# Patient Record
Sex: Female | Born: 1955 | Race: White | Hispanic: No | Marital: Married | Smoking: Former smoker
Health system: Southern US, Community
[De-identification: ages and names within clinical notes are randomized; demographics above are authoritative.]

## PROBLEM LIST (undated history)

## (undated) DIAGNOSIS — Z905 Acquired absence of kidney: Secondary | ICD-10-CM

## (undated) DIAGNOSIS — Z1501 Genetic susceptibility to malignant neoplasm of breast: Secondary | ICD-10-CM

## (undated) DIAGNOSIS — M1990 Unspecified osteoarthritis, unspecified site: Secondary | ICD-10-CM

## (undated) DIAGNOSIS — M5126 Other intervertebral disc displacement, lumbar region: Secondary | ICD-10-CM

## (undated) DIAGNOSIS — Z9889 Other specified postprocedural states: Secondary | ICD-10-CM

## (undated) DIAGNOSIS — Z1509 Genetic susceptibility to other malignant neoplasm: Secondary | ICD-10-CM

## (undated) DIAGNOSIS — I82409 Acute embolism and thrombosis of unspecified deep veins of unspecified lower extremity: Secondary | ICD-10-CM

## (undated) DIAGNOSIS — D0362 Melanoma in situ of left upper limb, including shoulder: Secondary | ICD-10-CM

## (undated) DIAGNOSIS — R112 Nausea with vomiting, unspecified: Secondary | ICD-10-CM

## (undated) HISTORY — DX: Acquired absence of kidney: Z90.5

## (undated) HISTORY — PX: BUNIONECTOMY: SHX129

## (undated) HISTORY — DX: Other intervertebral disc displacement, lumbar region: M51.26

## (undated) HISTORY — DX: Genetic susceptibility to other malignant neoplasm: Z15.09

## (undated) HISTORY — DX: Acute embolism and thrombosis of unspecified deep veins of unspecified lower extremity: I82.409

## (undated) HISTORY — DX: Melanoma in situ of left upper limb, including shoulder: D03.62

## (undated) HISTORY — DX: Genetic susceptibility to malignant neoplasm of breast: Z15.01

---

## 1985-02-02 HISTORY — PX: TUBAL LIGATION: SHX77

## 1986-02-02 HISTORY — PX: SALPINGOOPHORECTOMY: SHX82

## 1991-02-03 HISTORY — PX: RETINAL DETACHMENT SURGERY: SHX105

## 1991-02-03 HISTORY — PX: CERVICAL SPINE SURGERY: SHX589

## 2000-02-03 HISTORY — PX: ABDOMINAL HYSTERECTOMY: SHX81

## 2005-02-02 HISTORY — PX: TOTAL KNEE ARTHROPLASTY: SHX125

## 2008-06-28 DIAGNOSIS — Z1501 Genetic susceptibility to malignant neoplasm of breast: Secondary | ICD-10-CM

## 2008-06-28 HISTORY — DX: Genetic susceptibility to malignant neoplasm of breast: Z15.01

## 2008-08-09 ENCOUNTER — Encounter: Admission: RE | Admit: 2008-08-09 | Discharge: 2008-08-09 | Payer: Self-pay | Admitting: Obstetrics & Gynecology

## 2008-09-02 HISTORY — PX: SALPINGOOPHORECTOMY: SHX82

## 2008-09-04 ENCOUNTER — Ambulatory Visit (HOSPITAL_COMMUNITY): Admission: RE | Admit: 2008-09-04 | Discharge: 2008-09-04 | Payer: Self-pay | Admitting: Obstetrics & Gynecology

## 2008-09-04 ENCOUNTER — Encounter: Payer: Self-pay | Admitting: Obstetrics & Gynecology

## 2008-09-13 ENCOUNTER — Ambulatory Visit: Payer: Self-pay | Admitting: Oncology

## 2008-10-01 LAB — COMPREHENSIVE METABOLIC PANEL
ALT: 17 U/L (ref 0–35)
AST: 18 U/L (ref 0–37)
Albumin: 4.1 g/dL (ref 3.5–5.2)
Creatinine, Ser: 0.71 mg/dL (ref 0.40–1.20)
Potassium: 3.9 mEq/L (ref 3.5–5.3)

## 2008-10-01 LAB — CBC WITH DIFFERENTIAL/PLATELET
Basophils Absolute: 0 10*3/uL (ref 0.0–0.1)
Eosinophils Absolute: 0.5 10*3/uL (ref 0.0–0.5)
HCT: 35.2 % (ref 34.8–46.6)
LYMPH%: 30.5 % (ref 14.0–49.7)
MONO#: 0.3 10*3/uL (ref 0.1–0.9)
NEUT#: 1.9 10*3/uL (ref 1.5–6.5)
Platelets: 269 10*3/uL (ref 145–400)
WBC: 3.9 10*3/uL (ref 3.9–10.3)

## 2008-11-22 ENCOUNTER — Ambulatory Visit: Payer: Self-pay | Admitting: Oncology

## 2009-08-12 ENCOUNTER — Encounter: Admission: RE | Admit: 2009-08-12 | Discharge: 2009-08-12 | Payer: Self-pay | Admitting: Obstetrics & Gynecology

## 2009-10-23 HISTORY — PX: COLONOSCOPY: SHX174

## 2009-10-23 LAB — HM COLONOSCOPY

## 2010-03-21 ENCOUNTER — Other Ambulatory Visit: Payer: Self-pay | Admitting: Oncology

## 2010-03-21 ENCOUNTER — Encounter (HOSPITAL_BASED_OUTPATIENT_CLINIC_OR_DEPARTMENT_OTHER): Payer: BC Managed Care – PPO | Admitting: Oncology

## 2010-03-21 DIAGNOSIS — Z1501 Genetic susceptibility to malignant neoplasm of breast: Secondary | ICD-10-CM

## 2010-03-21 LAB — CBC WITH DIFFERENTIAL/PLATELET
BASO%: 1.2 % (ref 0.0–2.0)
Basophils Absolute: 0.1 10*3/uL (ref 0.0–0.1)
Eosinophils Absolute: 0.2 10*3/uL (ref 0.0–0.5)
HCT: 36.7 % (ref 34.8–46.6)
HGB: 12.7 g/dL (ref 11.6–15.9)
LYMPH%: 33 % (ref 14.0–49.7)
MCV: 99.7 fL (ref 79.5–101.0)
MONO#: 0.4 10*3/uL (ref 0.1–0.9)
NEUT#: 2.4 10*3/uL (ref 1.5–6.5)
NEUT%: 53.6 % (ref 38.4–76.8)
Platelets: 224 10*3/uL (ref 145–400)
WBC: 4.5 10*3/uL (ref 3.9–10.3)
lymph#: 1.5 10*3/uL (ref 0.9–3.3)

## 2010-03-21 LAB — COMPREHENSIVE METABOLIC PANEL
ALT: 18 U/L (ref 0–35)
AST: 19 U/L (ref 0–37)
Albumin: 4.6 g/dL (ref 3.5–5.2)
BUN: 16 mg/dL (ref 6–23)
Potassium: 3.8 mEq/L (ref 3.5–5.3)
Sodium: 141 mEq/L (ref 135–145)
Total Bilirubin: 0.9 mg/dL (ref 0.3–1.2)

## 2010-03-28 ENCOUNTER — Encounter (HOSPITAL_BASED_OUTPATIENT_CLINIC_OR_DEPARTMENT_OTHER): Payer: BC Managed Care – PPO | Admitting: Oncology

## 2010-03-28 DIAGNOSIS — Z1501 Genetic susceptibility to malignant neoplasm of breast: Secondary | ICD-10-CM

## 2010-05-10 LAB — CBC
HCT: 37.8 % (ref 36.0–46.0)
Hemoglobin: 12.8 g/dL (ref 12.0–15.0)
MCV: 102.4 fL — ABNORMAL HIGH (ref 78.0–100.0)
RDW: 12.9 % (ref 11.5–15.5)
WBC: 4.3 10*3/uL (ref 4.0–10.5)

## 2010-05-10 LAB — URINALYSIS, ROUTINE W REFLEX MICROSCOPIC
Bilirubin Urine: NEGATIVE
Protein, ur: NEGATIVE mg/dL
Urobilinogen, UA: 0.2 mg/dL (ref 0.0–1.0)
pH: 6.5 (ref 5.0–8.0)

## 2010-05-10 LAB — BASIC METABOLIC PANEL
CO2: 29 mEq/L (ref 19–32)
Creatinine, Ser: 0.67 mg/dL (ref 0.4–1.2)
GFR calc non Af Amer: >60 mL/min (ref >60)
Sodium: 141 mEq/L (ref 135–145)

## 2010-06-17 NOTE — Discharge Summary (Signed)
NAME:  Nicole Casey, SIMIEN NO.:  [id number]   MEDICAL RECORD NO.:  [id number]          PATIENT TYPE:  OIB   LOCATION:  1539                         FACILITY:  [facility]   PHYSICIAN:  M. Leda Quail, MD  DATE OF BIRTH:  [birth year]   DATE OF ADMISSION:  09/04/2008  DATE OF DISCHARGE:  09/04/2008                               DISCHARGE SUMMARY   ADMISSION DIAGNOSES:  45. A 55 year old G2, P2 married white female with a history of BRCA      (breast cancer)genetic positive testing, very strong family history      of ovarian and breast cancer.  2. History of right salpingo-oophorectomy in 1988.  3. History of hysterectomy in 2002 secondary to fibroids.   DISCHARGE DIAGNOSES:  35. A 55 year old G2, P2 married white female with a history of BRCA      (breast cancer)genetic positive testing, very strong family history      of ovarian and breast cancer.  2. History of right salpingo-oophorectomy in 1988.  3. History of hysterectomy in 2002 secondary to fibroids.  4. Adhesions of the bowel across the vaginal cuff along the left side.   PROCEDURES:  Laparoscopic left salpingo-oophorectomy and enterolysis  using the [employer] robot.   HISTORY OF PRESENT ILLNESS:  Nicole Casey is a 55 year old G2, P2 married  white female who has just recently undergone BRCA 1 and 2 genetic  testing secondary to strong family history of ovarian and breast cancer.  Her BRCA 1 testing came back positive.  She has been counseled about  options and will be seeing a hematologist for consideration of tamoxifen  therapy, but she did want to go ahead and proceed with left salpingo-  oophorectomy to decrease her risk of ovarian cancer.  She has been  counseled about risks and benefits of this and decided to proceed.   HOSPITAL COURSE:  The patient was admitted to same-day surgery.  She was  taken to the operating room where enterolysis of bowel adhesions to the  left sidewall and to the vaginal cuff was  performed as well as a  laparoscopic LSO.  The patient tolerated the procedure very well.  She  had less than 100 mL of blood loss.  From there, she was taken to the  recovery room.  She does have a long history of significant  postoperative nausea and emesis.  Because of this, I decided to observe  her through the day and make sure that she is doing well before sending  her home.  The patient after an appropriate time in the recovery room  was taken to the fifth floor.  She is able to tolerate liquids and  regular food and was able to be up and ambulate as well as void without  difficulty.  She was seen at approximately 5 o'clock in the evening and  was doing very well.  She had excellent pain control and no nausea.  She  was ready to go home, and I felt comfortable with this.  No  postoperative labs were done today.  Her preop labs this morning showed  a hemoglobin  of 12.8.  Electrolytes were normal.  Sodium of 141,  potassium a 4, BUN and creatinine of 13 and 0.6.  UA was negative.   PATHOLOGY PENDING:  Pathology for the left tube and ovary.   DISCHARGE INSTRUCTIONS:  Instructions were provided in written and  verbal form.  The patient will be discharged with a prescription for  Percocet 5/325, 1-2 tablets p.o. q.4-6 h. p.r.n. pain, and she will also  take Aleve  over-the-counter tablets as needed.  She will see me in 3-4 weeks, and I  will let her know about her pathology report when it is back later this  week.  At this point, she will be discharged to home with her family,  and she is in stable condition.      Lum Keas, MD  Electronically Signed     MSM/MEDQ  D:  09/04/2008  T:  09/04/2008  Job:  [id number]

## 2010-06-17 NOTE — Op Note (Signed)
NAME:  Nicole Casey, Nicole Casey            ACCOUNT NO.:  [id number]   MEDICAL RECORD NO.:  [id number]          PATIENT TYPE:  AMB   LOCATION:  DAY                          FACILITY:  [facility]   PHYSICIAN:  M. Leda Quail, MD  DATE OF BIRTH:  1955/04/05   DATE OF PROCEDURE:  09/04/2008  DATE OF DISCHARGE:                               OPERATIVE REPORT   PREOPERATIVE DIAGNOSES:  68. A 55 year old G2 P2 married white female with BRCA1-positive      genetic testing.  2. History of right salpingo-oophorectomy in 1988.  3. History of total abdominal hysterectomy secondary to fibroids in      2002.  4. History of bilateral tubal ligation.  5. Strong family history of ovarian and breast cancer which ultimately      led to the genetic testing.   POSTOPERATIVE DIAGNOSES:  29. A 55 year old G2 P2 married white female with BRCA1-positive      genetic testing.  2. History of right salpingo-oophorectomy in 1988.  3. History of total abdominal hysterectomy secondary to fibroids in      2002.  4. History of bilateral tubal ligation.  5. Strong family history of ovarian and breast cancer which ultimately      led to the genetic testing.  6. Adhesions of the bowel from the vaginal cuff all way around to the      left side and up the left sidewall on top of the fallopian tube and      ovary.   PROCEDURE:  Laparoscopic left salpingo-oophorectomy, done robotically,  and enterolysis.   ASSISTANT:  Cynthia P. Romine, M.D.   ANESTHESIA:  General endotracheal.  Dr. Jean Rosenthal oversaw the case.   FINDINGS:  Normal-appearing left ovary and fallopian tube although it  was adhesed to the sidewall and to the bowel.  There were also adhesions  of the bowel to the vaginal cuff starting on the right edge of the cuff  all way across and up on the left side which obscured the visualization  of the ovary until enterolysis was performed during the procedure.   SPECIMEN:  Left tube and ovary sent to pathology.   ESTIMATED BLOOD LOSS:  Anesthesia estimates 100 mL; probably this is an  overestimate.   URINE OUTPUT:  520 mL in the Foley catheter.   FLUIDS:  1000 mL of LR.   COMPLICATIONS:  None.   INDICATIONS:  Nicole Casey is a very nice 55 year old G2 P2 married white  female who just moved to [place] with her husband and children  for her husband's job.  Before removing she underwent genetic testing at  [facility] and did not have the full official report,  but was informed that she was genetically testing positive.  Initially  she believed that she was BRCA1 and BRCA2 positive.  I obtained her  records which actually showed that she was only BRCA1 positive.  We did  discuss implications of this increased risk of ovarian cancer.  She has  two paternal cousins who have both been diagnosed with breast cancer, a  paternal cousin with omental cancer, paternal grandmother with  ovarian  cancer, and paternal cousin with ovarian cancer.  All this in addition  to her daughter having an usual type of leukemia has led to her  proceeding with the genetic testing.  Risks and benefits of this  procedure were discussed with the patient.  There were all documented in  my office chart.  The patient wanted to go ahead and get this done as  soon as possible and was scheduled within a week after making her  decision.  She presents for procedure today.   PROCEDURE:  Patient taken to the operating room.  She is placed in the  supine position.  General endotracheal anesthesia is administered by the  anesthesia staff without difficulty.  Legs are positioned in the low  lithotomy position in [place] stirrups and then lifted to the high  lithotomy position.  The abdomen and perineum, inner thighs and vagina  are prepped in normal sterile fashion.  A sponge stick is placed in the  vagina.  Foley inserted under sterile conditions and the patient is then  draped in normal sterile fashion.  Legs are  positioned in the low  lithotomy position at this point.   Attention is turned to the umbilicus.  The skin is anesthetized with  0.25% Marcaine and incision is made above the umbilicus.  This is  dissected down to the fascia which is elevated with Kocher clamps and  incised sharply with curved Mayo scissors.  The peritoneum is identified  at this point and entered bluntly using a hemostat.  The operator's  index fingers pass through this incision and there are no adhesions  noted on the anterior abdominal wall.  A pursestring suture of 0 Vicryl  is placed around the incision at the fascial layer, then a Roseanne Reno is  placed through this incision and held into place with sutures.  The  scope is then used to visualize the pelvis.  The patient is placed with  the table in full and steep Trendelenburg.  There are obvious adhesions  of the bowel on the left side to the top of the cuff.  There is no  immediate identification of the left ovary.  Decision is made to go  ahead and insert ports.  Approximately 10 cm right and left of the  umbilical port the skin is anesthetized with 0.25% Marcaine.  The skin  is transilluminated to look for vessels.  Skin incisions are made and  #10 nondisposable trocars and ports are inserted in the right and left  lower quadrant.  Then, deeper on the right side, skin is  transilluminated and a port site location is chosen for the assistant  port.  Skin is anesthetized and incision is made with a knife, then  under direct visualization a #12 disposable trocar and port are placed  in the right lower quadrant.   At this point the robot is docked between the legs in normal standard  fashion.  There no assitant sat between the legs as the uterus had  already been removed in 2002.  The PK [place] is inserted into the left  robotic arm, attached to bipolar cautery, and endoscopic scissors,  attached to monopolar cautery, inserted into the right arm.  A Cobra  grasper  is used by Museum/gallery curator.   Initially for the first hour or so of the procedure enterolysis of the  bowel from the vaginal cuff and pelvic sidewall was performed.  Initially the ureter could not be identified so that careful attention  was made to keep the dissection only in filmy tissue that could clearly  be identified as safe for dissection.  Once the bowel was dissected off  of the cuff and off of the inferior portion of the left sidewall the  ureter peristalsis down low deep in the pelvis could be noted.  So the  course for the ureter could be better visualized.  Then the bowel was  dissected off of the left side wall.  At this point, the ovary could be  seen beneath the bowel.  Bowel was dissected off of the top of the  ovary.  Then slowly the ovary was dissected carefully off of the left  pelvic sidewall.  Traction was used and mostly endoscopic scissors  without cautery was used.  Only in a few places was monopolar cautery  used.  The tube was also adhesed, but the ovary could be separated off  the sidewall and then separated from the tube.  So, using bipolar  cautery, the ovary was taken off of the fallopian tube and the vessels  attaching the ovary to the tube were cauterized with bipolar cautery.  The ovary was brought out the assistant port without difficulty and  intact.   At this point the ureter was well below the level of dissection and the  tube was much more clearly identified at this point.  The remaining  portion of the tube was elevated and cleanly taken off of the sidewall.  It does appear that the ovary had been sutured to the left round  ligament.  The tube was scarred to the round ligament as well.  At this  point the round ligament was clearly identified which was difficult at  the beginning of surgery and it was bipolar cauterized for excellent  hemostasis.  The IP ligament was very clearly identified using the PK  [place] the IP ligament was serially  clamped, cauterized and incised to  completely traverse the IP ligament.  At this point the fallopian tube  was free as well from the left side.  This was brought out of the  assistant port.   Pelvis was irrigated with copious amounts of warm normal saline and  there was no bleeding noted along the pelvic sidewall.  The ureter  peristalsis was noted again and was definitely below the level of  dissection and particularly where the IP ligament was incised.  The  right side there was no ovary or tube present.  At this point an  Interceed layer was placed in the pelvis and placed across the vaginal  cuff and up on the left sidewall to help prevent future adhesion  formation.  At this point the procedure was ended.   The irrigant was removed from the pelvis.  The robot was undocked and  the instruments were removed under direct visualization of the  laparoscope.  The patient was taken out of Trendelenburg positioning and  the laparoscope was removed from the midline.  The patient was given  several deep breaths by the C.R.N.A. to try and get any gas out of the  abdomen.  Then the ports were removed under direct visualization.  Finally, the midline port was removed.  The operator's index finger was  placed into the midline port to ensure no bowel was present, none was,  and the incision was closed tightly with a pursestring suture.  The  right lower quadrant incision of the 12-mm port was closed at the  fascial level with figure-of-eight  suture of 0 Vicryl.  The incisions  were cleansed and closed with subcuticular stitches of 3-0 Vicryl.  Then  Dermabond was applied across and.  Instruments from the vagina were  removed and the Foley catheter was removed from the bladder.  Sponge,  lap, needle, instrument counts were correct x2 at this point.  The  patient was positioned back in supine position.  She was awakened from  anesthesia without difficulty and taken to the recovery room in stable   condition.      Lum Keas, MD  Electronically Signed     MSM/MEDQ  D:  09/04/2008  T:  09/04/2008  Job:  [id number]

## 2010-07-14 ENCOUNTER — Other Ambulatory Visit: Payer: Self-pay | Admitting: Obstetrics & Gynecology

## 2010-07-14 DIAGNOSIS — Z1231 Encounter for screening mammogram for malignant neoplasm of breast: Secondary | ICD-10-CM

## 2010-07-15 ENCOUNTER — Other Ambulatory Visit: Payer: Self-pay | Admitting: Oncology

## 2010-07-15 ENCOUNTER — Encounter (HOSPITAL_BASED_OUTPATIENT_CLINIC_OR_DEPARTMENT_OTHER): Payer: BC Managed Care – PPO | Admitting: Oncology

## 2010-07-15 DIAGNOSIS — Z1501 Genetic susceptibility to malignant neoplasm of breast: Secondary | ICD-10-CM

## 2010-07-15 LAB — CBC WITH DIFFERENTIAL/PLATELET
BASO%: 0.9 % (ref 0.0–2.0)
Basophils Absolute: 0 10*3/uL (ref 0.0–0.1)
HCT: 36.3 % (ref 34.8–46.6)
MCH: 34.3 pg — ABNORMAL HIGH (ref 25.1–34.0)
MONO#: 0.3 10*3/uL (ref 0.1–0.9)
MONO%: 6.8 % (ref 0.0–14.0)
NEUT%: 58.3 % (ref 38.4–76.8)
Platelets: 262 10*3/uL (ref 145–400)
RBC: 3.63 10*6/uL — ABNORMAL LOW (ref 3.70–5.45)
RDW: 12.9 % (ref 11.2–14.5)

## 2010-07-15 LAB — COMPREHENSIVE METABOLIC PANEL
AST: 23 U/L (ref 0–37)
Albumin: 4.5 g/dL (ref 3.5–5.2)
Alkaline Phosphatase: 74 U/L (ref 39–117)
Calcium: 9.2 mg/dL (ref 8.4–10.5)
Glucose, Bld: 108 mg/dL — ABNORMAL HIGH (ref 70–99)
Potassium: 4.3 mEq/L (ref 3.5–5.3)
Sodium: 136 mEq/L (ref 135–145)
Total Protein: 6.6 g/dL (ref 6.0–8.3)

## 2010-08-15 ENCOUNTER — Ambulatory Visit
Admission: RE | Admit: 2010-08-15 | Discharge: 2010-08-15 | Disposition: A | Discharge status: Home / Self Care | Payer: BC Managed Care – PPO | Source: Ambulatory Visit | Attending: Obstetrics & Gynecology | Admitting: Obstetrics & Gynecology

## 2010-08-15 DIAGNOSIS — Z1231 Encounter for screening mammogram for malignant neoplasm of breast: Secondary | ICD-10-CM

## 2010-08-18 ENCOUNTER — Ambulatory Visit: Payer: BC Managed Care – PPO

## 2010-09-09 ENCOUNTER — Other Ambulatory Visit: Payer: Self-pay | Admitting: Obstetrics & Gynecology

## 2010-09-09 DIAGNOSIS — Z1501 Genetic susceptibility to malignant neoplasm of breast: Secondary | ICD-10-CM

## 2010-09-17 ENCOUNTER — Other Ambulatory Visit: Payer: BC Managed Care – PPO

## 2011-07-16 ENCOUNTER — Other Ambulatory Visit: Payer: Self-pay | Admitting: Obstetrics & Gynecology

## 2011-07-16 DIAGNOSIS — Z1231 Encounter for screening mammogram for malignant neoplasm of breast: Secondary | ICD-10-CM

## 2011-08-17 ENCOUNTER — Ambulatory Visit
Admission: RE | Admit: 2011-08-17 | Discharge: 2011-08-17 | Disposition: A | Discharge status: Home / Self Care | Payer: BC Managed Care – PPO | Source: Ambulatory Visit | Attending: Obstetrics & Gynecology | Admitting: Obstetrics & Gynecology

## 2011-08-17 DIAGNOSIS — Z1231 Encounter for screening mammogram for malignant neoplasm of breast: Secondary | ICD-10-CM

## 2012-07-11 ENCOUNTER — Other Ambulatory Visit: Payer: Self-pay

## 2012-07-11 DIAGNOSIS — Z1231 Encounter for screening mammogram for malignant neoplasm of breast: Secondary | ICD-10-CM

## 2012-07-13 ENCOUNTER — Other Ambulatory Visit: Payer: Self-pay | Admitting: Neurosurgery

## 2012-07-13 DIAGNOSIS — M4310 Spondylolisthesis, site unspecified: Secondary | ICD-10-CM

## 2012-07-22 ENCOUNTER — Ambulatory Visit
Admission: RE | Admit: 2012-07-22 | Discharge: 2012-07-22 | Disposition: A | Discharge status: Home / Self Care | Payer: BC Managed Care – PPO | Source: Ambulatory Visit | Attending: Neurosurgery | Admitting: Neurosurgery

## 2012-07-22 VITALS — BP 110/62 | HR 58

## 2012-07-22 DIAGNOSIS — M4310 Spondylolisthesis, site unspecified: Secondary | ICD-10-CM

## 2012-07-22 MED ORDER — IOHEXOL 180 MG/ML  SOLN
20.0000 mL | Freq: Once | INTRAMUSCULAR | Status: AC | PRN
  Administered 2012-07-22: 20 mL via INTRATHECAL

## 2012-07-22 MED ORDER — DIAZEPAM 5 MG PO TABS
10.0000 mg | ORAL_TABLET | Freq: Once | ORAL | Status: AC
  Administered 2012-07-22: 10 mg via ORAL

## 2012-07-22 NOTE — Progress Notes (Signed)
Discharge instructions explained. 

## 2012-08-19 ENCOUNTER — Ambulatory Visit
Admission: RE | Admit: 2012-08-19 | Discharge: 2012-08-19 | Disposition: A | Discharge status: Home / Self Care | Payer: BC Managed Care – PPO | Source: Ambulatory Visit

## 2012-08-19 DIAGNOSIS — Z1231 Encounter for screening mammogram for malignant neoplasm of breast: Secondary | ICD-10-CM

## 2012-09-26 ENCOUNTER — Encounter: Payer: Self-pay | Admitting: Nurse Practitioner

## 2012-09-26 ENCOUNTER — Ambulatory Visit (INDEPENDENT_AMBULATORY_CARE_PROVIDER_SITE_OTHER): Payer: BC Managed Care – PPO | Admitting: Nurse Practitioner

## 2012-09-26 VITALS — BP 110/74 | HR 64 | Resp 16 | Ht 66.0 in | Wt 173.0 lb

## 2012-09-26 DIAGNOSIS — Z01419 Encounter for gynecological examination (general) (routine) without abnormal findings: Secondary | ICD-10-CM

## 2012-09-26 DIAGNOSIS — E559 Vitamin D deficiency, unspecified: Secondary | ICD-10-CM

## 2012-09-26 DIAGNOSIS — Z0000 Encounter for general adult medical examination without abnormal findings: Secondary | ICD-10-CM

## 2012-09-26 LAB — COMPREHENSIVE METABOLIC PANEL
BUN: 20 mg/dL (ref 6–23)
Calcium: 9.4 mg/dL (ref 8.4–10.5)
Potassium: 4.7 mEq/L (ref 3.5–5.3)
Sodium: 138 mEq/L (ref 135–145)
Total Bilirubin: 0.9 mg/dL (ref 0.3–1.2)

## 2012-09-26 LAB — LIPID PANEL
HDL: 74 mg/dL (ref >39)
LDL Cholesterol: 84 mg/dL (ref 0–99)
Total CHOL/HDL Ratio: 2.3 Ratio
Triglycerides: 63 mg/dL (ref <150)

## 2012-09-26 LAB — TSH: TSH: 1.652 u[IU]/mL (ref 0.350–4.500)

## 2012-09-26 LAB — POCT URINALYSIS DIPSTICK
Bilirubin, UA: NEGATIVE
Blood, UA: NEGATIVE
Leukocytes, UA: NEGATIVE
Urobilinogen, UA: NEGATIVE

## 2012-09-26 MED ORDER — VITAMIN D (ERGOCALCIFEROL) 1.25 MG (50000 UNIT) PO CAPS: 50000.0000 [IU] | ORAL_CAPSULE | ORAL | Status: DC

## 2012-09-26 NOTE — Patient Instructions (Signed)

## 2012-09-26 NOTE — Progress Notes (Addendum)
Patient ID: Nicole Casey, female   DOB: [birth year], 57 y.o.   MRN: [id number] 57 y.o. G2, P2. Married Caucasian Fe here for annual exam.  Having back pain and upcoming surgery is pending. 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  No LMP recorded. Patient has had a hysterectomy.          Sexually active: yes  The current method of family planning is status post hysterectomy.    Exercising: yes  Gym/ health club routine includes high impact aerobics  and light weights. Smoker:  no  Health Maintenance: Pap:  09/24/11, WNL, neg HR HPV MMG:  08/19/12, BI-Rads 1: negative Colonoscopy:  10/23/09 normal repeat in 10 years BMD:   08/2008 normal TDaP:  09/19/10 Labs: HB:  12.9 Urine: Neg, pH 7.0   reports that she quit smoking about 36 years ago. Her smoking use included Cigarettes. She has a 2 pack-year smoking history. She has never used smokeless tobacco. She reports that  drinks alcohol. She reports that she does not use illicit drugs.  Past Medical History  Diagnosis Date  . BRCA gene positive   . Herniated lumbar disc without myelopathy     L 3-4-5    Past Surgical History  Procedure Laterality Date  . Cervical spine surgery  1993    hernaited disc repair  . Total knee arthroplasty Right 2007  . Abdominal hysterectomy  2002    fibroids, DUB  . Tubal ligation Bilateral 1987  . Salpingoophorectomy Right 1988    secondary to cyst  . Salpingoophorectomy Left 09/2008    secondary to BRCA +  . Retinal detachment surgery Right 1993    Current Outpatient Prescriptions  Medication Sig Dispense Refill  . Naproxen Sodium (ALEVE) 220 MG CAPS Take 1 capsule by mouth daily.      . Vitamin D, Ergocalciferol, (DRISDOL) 50000 UNITS CAPS capsule Take 1 capsule (50,000 Units total) by mouth every 7 (seven) days.  30 capsule  2   No current facility-administered medications for this visit.    Family History  Problem Relation Age of Onset  . Ovarian cancer Paternal Grandmother   .  Breast cancer Cousin     paternal  . Ovarian cancer Cousin     paternal  . Cervical cancer Cousin     paternal  . Breast cancer Cousin     paternal  . Leukemia Daughter     ROS:  Pertinent items are noted in HPI.  Otherwise, a comprehensive ROS was negative.  Exam:   BP 110/74  Pulse 64  Resp 16  Ht 5\' 6"  (1.676 m)  Wt 173 lb (78.472 kg)  BMI 27.94 kg/m2 Height: 5\' 6"  (167.6 cm)  Ht Readings from Last 3 Encounters:  09/26/12 5\' 6"  (1.676 m)    General appearance: alert, cooperative and appears stated age Head: Normocephalic, without obvious abnormality, atraumatic Neck: no adenopathy, supple, symmetrical, trachea midline and thyroid normal to inspection and palpation Lungs: clear to auscultation bilaterally Breasts: normal appearance, no masses or tenderness Heart: regular rate and rhythm Abdomen: soft, non-tender; no masses,  no organomegaly Extremities: extremities normal, atraumatic, no cyanosis or edema Skin: Skin color, texture, turgor normal. No rashes or lesions Lymph nodes: Cervical, supraclavicular, and axillary nodes normal. No abnormal inguinal nodes palpated Neurologic: Grossly normal   Pelvic: External genitalia:  no lesions              Urethra:  normal appearing urethra with no masses, tenderness or lesions  Bartholin's and Skene's: normal                 Vagina: normal appearing vagina with normal color and discharge, no lesions              Cervix: absent              Pap taken: no Bimanual Exam:  Uterus:  uterus absent              Adnexa: no mass, fullness, tenderness               Rectovaginal: Confirms               Anus:  normal sphincter tone, no lesions  A:  Well Woman with normal exam  Postmenopausal  Strong FMH of OV, Breast cancer   S/P TAH 2002 secondary to fibroids and DUB  S/P BSO 1988 secondary to cyst & 09/2008 secondary to BRCA I +  Patient with BRCA I + = unable to tolerate Tamoxifen 2/12   History of Vit d  deficiency  P:   Pap smear as per guidelines Not done  Mammogram due 7/15  Refill Vit D 50,000 IU weekly for a year - pending labs  Counseled on breast self exam, adequate intake of calcium and vitamin D, diet and exercise return annually or prn  An After Visit Summary was printed and given to the patient.

## 2012-09-27 ENCOUNTER — Telehealth: Payer: Self-pay | Admitting: *Deleted

## 2012-09-27 LAB — VITAMIN D 25 HYDROXY (VIT D DEFICIENCY, FRACTURES): Vit D, 25-Hydroxy: 51 ng/mL (ref 30–89)

## 2012-09-27 NOTE — Progress Notes (Signed)
Encounter reviewed by Dr. Conley Simmonds.  I recommend referral to [facility] to their high risk clinic for those who are cancer gene mutation carriers.

## 2012-09-27 NOTE — Telephone Encounter (Signed)
Message copied by Luisa Dago on Tue Sep 27, 2012  2:05 PM ------      Message from: Ria Comment R      Created: Tue Sep 27, 2012  1:12 PM       Let patient know that labs are all great. ------

## 2012-09-27 NOTE — Telephone Encounter (Signed)
I have attempted to contact this patient by phone with the following results: left message to return my call on answering machine (home per ROI).  

## 2012-09-27 NOTE — Telephone Encounter (Signed)
Patient returning Stephanie's call.

## 2012-09-27 NOTE — Telephone Encounter (Signed)
Message left to return call.

## 2012-09-28 ENCOUNTER — Other Ambulatory Visit: Payer: Self-pay | Admitting: Neurosurgery

## 2012-09-28 LAB — HEMOGLOBIN, FINGERSTICK: Hemoglobin, fingerstick: 12.9 g/dL (ref 12.0–16.0)

## 2012-09-28 NOTE — Telephone Encounter (Signed)
Notified of results

## 2012-09-28 NOTE — Telephone Encounter (Signed)
Returning your phone call.

## 2012-10-03 HISTORY — PX: LUMBAR FUSION: SHX111

## 2012-10-04 ENCOUNTER — Telehealth: Payer: Self-pay | Admitting: Nurse Practitioner

## 2012-10-04 NOTE — Telephone Encounter (Signed)
Left patient a message about calling us back - I wanted to share with her information that Dr. Edward Jolly had about her + BRCA testing and that she should be referred to [facility] for their intensive program for monitoring breast cancer.  She may benefit from Evista.

## 2012-10-04 NOTE — Telephone Encounter (Signed)
Patient is calling you back. 

## 2012-10-13 ENCOUNTER — Encounter (HOSPITAL_COMMUNITY): Payer: Self-pay | Admitting: Respiratory Therapy

## 2012-10-14 ENCOUNTER — Encounter (HOSPITAL_COMMUNITY)
Admission: RE | Admit: 2012-10-14 | Discharge: 2012-10-14 | Disposition: A | Discharge status: Home / Self Care | Payer: BC Managed Care – PPO | Source: Ambulatory Visit | Attending: Neurosurgery | Admitting: Neurosurgery

## 2012-10-14 ENCOUNTER — Encounter (HOSPITAL_COMMUNITY): Payer: Self-pay

## 2012-10-14 DIAGNOSIS — Z01812 Encounter for preprocedural laboratory examination: Secondary | ICD-10-CM | POA: Insufficient documentation

## 2012-10-14 DIAGNOSIS — Z01818 Encounter for other preprocedural examination: Secondary | ICD-10-CM | POA: Insufficient documentation

## 2012-10-14 HISTORY — DX: Unspecified osteoarthritis, unspecified site: M19.90

## 2012-10-14 HISTORY — DX: Nausea with vomiting, unspecified: R11.2

## 2012-10-14 HISTORY — DX: Other specified postprocedural states: Z98.890

## 2012-10-14 LAB — CBC
HCT: 37.8 % (ref 36.0–46.0)
MCHC: 34.7 g/dL (ref 30.0–36.0)
MCV: 97.7 fL (ref 78.0–100.0)
RDW: 12.2 % (ref 11.5–15.5)

## 2012-10-14 LAB — SURGICAL PCR SCREEN: Staphylococcus aureus: NEGATIVE

## 2012-10-14 NOTE — Pre-Procedure Instructions (Signed)
Nicole Casey  10/14/2012   Your procedure is scheduled on:  Sept 24  @ 0830   Report to [facility] at 0630 AM.  Call this number if you have problems the morning of surgery: [phone]   Remember:   Do not eat food or drink liquids after midnight.              Stop  Aleve 7 days prior to your surgery.  No Aspirin, BC's Goody's, Fish oil, Ibuprofen, or Herbal Medications    Do not wear jewelry, make-up or nail polish.  Do not wear lotions, powders, or perfumes. You may wear deodorant.  Do not shave 48 hours prior to surgery.  Do not bring valuables to the hospital.  [facility] is not responsible                   for any belongings or valuables.  Contacts, dentures or bridgework may not be worn into surgery.  Leave suitcase in the car. After surgery it may be brought to your room.  For patients admitted to the hospital, checkout time is 11:00 AM the day of  discharge.   Patients discharged the day of surgery will not be allowed to drive  home.    Special Instructions: Shower using CHG 2 nights before surgery and the night before surgery.  If you shower the day of surgery use CHG.  Use special wash - you have one bottle of CHG for all showers.  You should use approximately 1/3 of the bottle for each shower.   Please read over the following fact sheets that you were given: Pain Booklet, Coughing and Deep Breathing, MRSA Information and Surgical Site Infection Prevention

## 2012-10-14 NOTE — Progress Notes (Signed)
Pt Denies having a stress test, echo, cardio cath. EKG, or chest xray.  Denies having a cardiologist, or PCP.

## 2012-10-18 ENCOUNTER — Telehealth: Payer: Self-pay | Admitting: Nurse Practitioner

## 2012-10-18 NOTE — Telephone Encounter (Signed)
Patient was called on both home and cell number and finally was able to let her know of Dr. Rica Records recommendation about going to the High risk [facility] program for more intensive following of her breast cancer risk with BRCA I positive and unable to tolerate Tamoxifen.  She may benefit from Evista.

## 2012-10-19 NOTE — Telephone Encounter (Signed)
Thanks for the update

## 2012-10-25 MED ORDER — CEFAZOLIN SODIUM-DEXTROSE 2-3 GM-% IV SOLR
2.0000 g | INTRAVENOUS | Status: AC
  Administered 2012-10-26: 2 g via INTRAVENOUS
  Filled 2012-10-25: qty 50

## 2012-10-26 ENCOUNTER — Inpatient Hospital Stay (HOSPITAL_COMMUNITY): Payer: BC Managed Care – PPO

## 2012-10-26 ENCOUNTER — Encounter (HOSPITAL_COMMUNITY): Payer: Self-pay | Admitting: Anesthesiology

## 2012-10-26 ENCOUNTER — Encounter (HOSPITAL_COMMUNITY): Payer: Self-pay | Admitting: *Deleted

## 2012-10-26 ENCOUNTER — Inpatient Hospital Stay (HOSPITAL_COMMUNITY): Payer: BC Managed Care – PPO | Admitting: Anesthesiology

## 2012-10-26 ENCOUNTER — Inpatient Hospital Stay (HOSPITAL_COMMUNITY)
Admission: RE | Admit: 2012-10-26 | Discharge: 2012-10-30 | DRG: 756 | Disposition: A | Discharge status: Home / Self Care | Payer: BC Managed Care – PPO | Source: Ambulatory Visit | Attending: Neurosurgery | Admitting: Neurosurgery

## 2012-10-26 ENCOUNTER — Encounter (HOSPITAL_COMMUNITY)
Admission: RE | Disposition: A | Discharge status: Home / Self Care | Payer: BC Managed Care – PPO | Source: Ambulatory Visit | Attending: Neurosurgery

## 2012-10-26 DIAGNOSIS — M5126 Other intervertebral disc displacement, lumbar region: Principal | ICD-10-CM | POA: Diagnosis present

## 2012-10-26 DIAGNOSIS — Z87891 Personal history of nicotine dependence: Secondary | ICD-10-CM

## 2012-10-26 DIAGNOSIS — M4310 Spondylolisthesis, site unspecified: Secondary | ICD-10-CM | POA: Diagnosis present

## 2012-10-26 DIAGNOSIS — Z96659 Presence of unspecified artificial knee joint: Secondary | ICD-10-CM

## 2012-10-26 SURGERY — POSTERIOR LUMBAR FUSION 1 LEVEL
Anesthesia: General | Site: Back | Wound class: Clean

## 2012-10-26 MED ORDER — ONDANSETRON HCL 4 MG/2ML IJ SOLN
INTRAMUSCULAR | Status: DC | PRN
  Administered 2012-10-26: 4 mg via INTRAVENOUS

## 2012-10-26 MED ORDER — SODIUM CHLORIDE 0.9 % IJ SOLN: 3.0000 mL | INTRAMUSCULAR | Status: DC | PRN

## 2012-10-26 MED ORDER — SCOPOLAMINE 1 MG/3DAYS TD PT72: 1.0000 | MEDICATED_PATCH | TRANSDERMAL | Status: DC

## 2012-10-26 MED ORDER — ROCURONIUM BROMIDE 100 MG/10ML IV SOLN
INTRAVENOUS | Status: DC | PRN
  Administered 2012-10-26: 50 mg via INTRAVENOUS

## 2012-10-26 MED ORDER — ZOLPIDEM TARTRATE 5 MG PO TABS: 5.0000 mg | ORAL_TABLET | Freq: Every evening | ORAL | Status: DC | PRN

## 2012-10-26 MED ORDER — SODIUM CHLORIDE 0.9 % IV SOLN
INTRAVENOUS | Status: DC
  Administered 2012-10-26: 14:00:00 via INTRAVENOUS

## 2012-10-26 MED ORDER — SODIUM CHLORIDE 0.9 % IV SOLN: 250.0000 mL | INTRAVENOUS | Status: DC

## 2012-10-26 MED ORDER — SCOPOLAMINE 1 MG/3DAYS TD PT72
MEDICATED_PATCH | TRANSDERMAL | Status: AC
  Administered 2012-10-26: 1 via TRANSDERMAL
  Filled 2012-10-26: qty 1

## 2012-10-26 MED ORDER — ONDANSETRON HCL 4 MG/2ML IJ SOLN: 4.0000 mg | Freq: Four times a day (QID) | INTRAMUSCULAR | Status: DC | PRN

## 2012-10-26 MED ORDER — OXYCODONE HCL 5 MG PO TABS
5.0000 mg | ORAL_TABLET | Freq: Once | ORAL | Status: AC | PRN
  Administered 2012-10-26: 5 mg via ORAL

## 2012-10-26 MED ORDER — CEFAZOLIN SODIUM 1-5 GM-% IV SOLN
1.0000 g | Freq: Three times a day (TID) | INTRAVENOUS | Status: AC
  Administered 2012-10-26 – 2012-10-27 (×2): 1 g via INTRAVENOUS
  Filled 2012-10-26 (×3): qty 50

## 2012-10-26 MED ORDER — OXYCODONE HCL 5 MG/5ML PO SOLN: 5.0000 mg | Freq: Once | ORAL | Status: AC | PRN

## 2012-10-26 MED ORDER — ACETAMINOPHEN 325 MG PO TABS
650.0000 mg | ORAL_TABLET | ORAL | Status: DC | PRN
  Administered 2012-10-28 – 2012-10-30 (×10): 650 mg via ORAL
  Filled 2012-10-26 (×10): qty 2

## 2012-10-26 MED ORDER — MORPHINE SULFATE (PF) 1 MG/ML IV SOLN
INTRAVENOUS | Status: DC
  Administered 2012-10-26: 4.5 mg via INTRAVENOUS
  Administered 2012-10-26: 13:00:00 via INTRAVENOUS
  Administered 2012-10-26: 3 mg via INTRAVENOUS
  Administered 2012-10-27: 3.6 mg via INTRAVENOUS
  Administered 2012-10-27: 21:00:00 via INTRAVENOUS
  Administered 2012-10-27: 1.5 mg via INTRAVENOUS
  Administered 2012-10-27: 3 mg via INTRAVENOUS
  Administered 2012-10-27: 2.19 mg via INTRAVENOUS
  Administered 2012-10-27: 3 mg via INTRAVENOUS
  Administered 2012-10-27: 3.4 mg via INTRAVENOUS
  Administered 2012-10-27 – 2012-10-28 (×2): 1.5 mg via INTRAVENOUS
  Filled 2012-10-26: qty 25

## 2012-10-26 MED ORDER — HYDROMORPHONE HCL PF 1 MG/ML IJ SOLN
INTRAMUSCULAR | Status: AC
  Filled 2012-10-26: qty 1

## 2012-10-26 MED ORDER — PROPOFOL 10 MG/ML IV BOLUS
INTRAVENOUS | Status: DC | PRN
  Administered 2012-10-26: 100 mg via INTRAVENOUS

## 2012-10-26 MED ORDER — PROMETHAZINE HCL 25 MG/ML IJ SOLN: 6.2500 mg | INTRAMUSCULAR | Status: DC | PRN

## 2012-10-26 MED ORDER — SODIUM CHLORIDE 0.9 % IJ SOLN: 9.0000 mL | INTRAMUSCULAR | Status: DC | PRN

## 2012-10-26 MED ORDER — SODIUM CHLORIDE 0.9 % IJ SOLN
3.0000 mL | Freq: Two times a day (BID) | INTRAMUSCULAR | Status: DC
  Administered 2012-10-28: 3 mL via INTRAVENOUS

## 2012-10-26 MED ORDER — FENTANYL CITRATE 0.05 MG/ML IJ SOLN
INTRAMUSCULAR | Status: DC | PRN
  Administered 2012-10-26 (×2): 50 ug via INTRAVENOUS
  Administered 2012-10-26 (×2): 150 ug via INTRAVENOUS
  Administered 2012-10-26: 100 ug via INTRAVENOUS

## 2012-10-26 MED ORDER — ACETAMINOPHEN 650 MG RE SUPP: 650.0000 mg | RECTAL | Status: DC | PRN

## 2012-10-26 MED ORDER — PHENOL 1.4 % MT LIQD: 1.0000 | OROMUCOSAL | Status: DC | PRN

## 2012-10-26 MED ORDER — ARTIFICIAL TEARS OP OINT
TOPICAL_OINTMENT | OPHTHALMIC | Status: DC | PRN
  Administered 2012-10-26: 1 via OPHTHALMIC

## 2012-10-26 MED ORDER — MIDAZOLAM HCL 2 MG/2ML IJ SOLN: 0.5000 mg | Freq: Once | INTRAMUSCULAR | Status: DC | PRN

## 2012-10-26 MED ORDER — DIAZEPAM 5 MG PO TABS
5.0000 mg | ORAL_TABLET | Freq: Four times a day (QID) | ORAL | Status: DC | PRN
  Administered 2012-10-27 – 2012-10-29 (×2): 5 mg via ORAL
  Filled 2012-10-26 (×3): qty 1

## 2012-10-26 MED ORDER — MIDAZOLAM HCL 5 MG/5ML IJ SOLN
INTRAMUSCULAR | Status: DC | PRN
  Administered 2012-10-26: 2 mg via INTRAVENOUS

## 2012-10-26 MED ORDER — DEXAMETHASONE SODIUM PHOSPHATE 4 MG/ML IJ SOLN
INTRAMUSCULAR | Status: DC | PRN
  Administered 2012-10-26: 8 mg via INTRAVENOUS

## 2012-10-26 MED ORDER — MORPHINE SULFATE (PF) 1 MG/ML IV SOLN
INTRAVENOUS | Status: AC
  Filled 2012-10-26: qty 25

## 2012-10-26 MED ORDER — DIPHENHYDRAMINE HCL 12.5 MG/5ML PO ELIX: 12.5000 mg | ORAL_SOLUTION | Freq: Four times a day (QID) | ORAL | Status: DC | PRN

## 2012-10-26 MED ORDER — OXYCODONE HCL 5 MG PO TABS
ORAL_TABLET | ORAL | Status: AC
  Filled 2012-10-26: qty 1

## 2012-10-26 MED ORDER — LACTATED RINGERS IV SOLN
INTRAVENOUS | Status: DC | PRN
  Administered 2012-10-26 (×3): via INTRAVENOUS

## 2012-10-26 MED ORDER — GLYCOPYRROLATE 0.2 MG/ML IJ SOLN
INTRAMUSCULAR | Status: DC | PRN
  Administered 2012-10-26: 0.4 mg via INTRAVENOUS

## 2012-10-26 MED ORDER — LIDOCAINE HCL (CARDIAC) 20 MG/ML IV SOLN
INTRAVENOUS | Status: DC | PRN
  Administered 2012-10-26: 30 mg via INTRAVENOUS

## 2012-10-26 MED ORDER — MEPERIDINE HCL 25 MG/ML IJ SOLN: 6.2500 mg | INTRAMUSCULAR | Status: DC | PRN

## 2012-10-26 MED ORDER — MENTHOL 3 MG MT LOZG: 1.0000 | LOZENGE | OROMUCOSAL | Status: DC | PRN

## 2012-10-26 MED ORDER — THROMBIN 20000 UNITS EX SOLR
CUTANEOUS | Status: DC | PRN
  Administered 2012-10-26: 10:00:00 via TOPICAL

## 2012-10-26 MED ORDER — NEOSTIGMINE METHYLSULFATE 1 MG/ML IJ SOLN
INTRAMUSCULAR | Status: DC | PRN
  Administered 2012-10-26: 3 mg via INTRAVENOUS

## 2012-10-26 MED ORDER — ONDANSETRON HCL 4 MG/2ML IJ SOLN
4.0000 mg | INTRAMUSCULAR | Status: DC | PRN
  Administered 2012-10-27: 4 mg via INTRAVENOUS
  Filled 2012-10-26: qty 2

## 2012-10-26 MED ORDER — NALOXONE HCL 0.4 MG/ML IJ SOLN: 0.4000 mg | INTRAMUSCULAR | Status: DC | PRN

## 2012-10-26 MED ORDER — 0.9 % SODIUM CHLORIDE (POUR BTL) OPTIME
TOPICAL | Status: DC | PRN
  Administered 2012-10-26: 1000 mL

## 2012-10-26 MED ORDER — HYDROMORPHONE HCL PF 1 MG/ML IJ SOLN
0.2500 mg | INTRAMUSCULAR | Status: DC | PRN
  Administered 2012-10-26 (×4): 0.5 mg via INTRAVENOUS

## 2012-10-26 MED ORDER — DIPHENHYDRAMINE HCL 50 MG/ML IJ SOLN: 12.5000 mg | Freq: Four times a day (QID) | INTRAMUSCULAR | Status: DC | PRN

## 2012-10-26 MED ORDER — OXYCODONE-ACETAMINOPHEN 5-325 MG PO TABS: 1.0000 | ORAL_TABLET | ORAL | Status: DC | PRN

## 2012-10-26 SURGICAL SUPPLY — 68 items
BENZOIN TINCTURE PRP APPL 2/3 (GAUZE/BANDAGES/DRESSINGS) ×2 IMPLANT
BLADE SURG ROTATE 9660 (MISCELLANEOUS) IMPLANT
BUR ACORN 6.0 (BURR) ×2 IMPLANT
BUR MATCHSTICK NEURO 3.0 LAGG (BURR) ×2 IMPLANT
CANISTER SUCTION 2500CC (MISCELLANEOUS) ×2 IMPLANT
CAP LOCKING REVERE (Cap) ×8 IMPLANT
CLOTH BEACON ORANGE TIMEOUT ST (SAFETY) IMPLANT
CONT SPEC 4OZ CLIKSEAL STRL BL (MISCELLANEOUS) ×2 IMPLANT
COVER BACK TABLE 24X17X13 BIG (DRAPES) IMPLANT
COVER TABLE BACK 60X90 (DRAPES) ×2 IMPLANT
DRAPE C-ARM 42X72 X-RAY (DRAPES) ×4 IMPLANT
DRAPE LAPAROTOMY 100X72X124 (DRAPES) ×2 IMPLANT
DRAPE POUCH INSTRU U-SHP 10X18 (DRAPES) ×2 IMPLANT
DRSG OPSITE POSTOP 4X8 (GAUZE/BANDAGES/DRESSINGS) ×4 IMPLANT
DRSG PAD ABDOMINAL 8X10 ST (GAUZE/BANDAGES/DRESSINGS) ×2 IMPLANT
DURAPREP 26ML APPLICATOR (WOUND CARE) ×2 IMPLANT
ELECT BLADE 4.0 EZ CLEAN MEGAD (MISCELLANEOUS) ×2
ELECT REM PT RETURN 9FT ADLT (ELECTROSURGICAL) ×2
ELECTRODE BLDE 4.0 EZ CLN MEGD (MISCELLANEOUS) ×1 IMPLANT
ELECTRODE REM PT RTRN 9FT ADLT (ELECTROSURGICAL) ×1 IMPLANT
EVACUATOR 1/8 PVC DRAIN (DRAIN) IMPLANT
EVACUATOR 3/16  PVC DRAIN (DRAIN) ×1
EVACUATOR 3/16 PVC DRAIN (DRAIN) ×1 IMPLANT
GAUZE SPONGE 4X4 16PLY XRAY LF (GAUZE/BANDAGES/DRESSINGS) ×2 IMPLANT
GLOVE BIOGEL M 8.0 STRL (GLOVE) ×4 IMPLANT
GLOVE BIOGEL PI IND STRL 7.5 (GLOVE) ×1 IMPLANT
GLOVE BIOGEL PI IND STRL 8 (GLOVE) ×1 IMPLANT
GLOVE BIOGEL PI INDICATOR 7.5 (GLOVE) ×1
GLOVE BIOGEL PI INDICATOR 8 (GLOVE) ×1
GLOVE ECLIPSE 7.0 STRL STRAW (GLOVE) ×2 IMPLANT
GLOVE ECLIPSE 7.5 STRL STRAW (GLOVE) ×6 IMPLANT
GLOVE EXAM NITRILE LRG STRL (GLOVE) IMPLANT
GLOVE EXAM NITRILE MD LF STRL (GLOVE) IMPLANT
GLOVE EXAM NITRILE XL STR (GLOVE) IMPLANT
GLOVE EXAM NITRILE XS STR PU (GLOVE) IMPLANT
GOWN BRE IMP SLV AUR LG STRL (GOWN DISPOSABLE) ×4 IMPLANT
GOWN BRE IMP SLV AUR XL STRL (GOWN DISPOSABLE) ×2 IMPLANT
GOWN STRL REIN 2XL LVL4 (GOWN DISPOSABLE) ×2 IMPLANT
KIT BASIN OR (CUSTOM PROCEDURE TRAY) ×2 IMPLANT
KIT INFUSE MEDIUM (Orthopedic Implant) ×2 IMPLANT
KIT ROOM TURNOVER OR (KITS) ×2 IMPLANT
NEEDLE HYPO 18GX1.5 BLUNT FILL (NEEDLE) IMPLANT
NEEDLE HYPO 21X1.5 SAFETY (NEEDLE) IMPLANT
NEEDLE HYPO 25X1 1.5 SAFETY (NEEDLE) ×2 IMPLANT
NS IRRIG 1000ML POUR BTL (IV SOLUTION) ×2 IMPLANT
PACK LAMINECTOMY NEURO (CUSTOM PROCEDURE TRAY) ×2 IMPLANT
PAD ARMBOARD 7.5X6 YLW CONV (MISCELLANEOUS) ×6 IMPLANT
PATTIES SURGICAL .5 X1 (DISPOSABLE) IMPLANT
PATTIES SURGICAL .5 X3 (DISPOSABLE) IMPLANT
ROD CURVED 5.5X45MM (Rod) ×4 IMPLANT
SCREW PEDICLE REVERE 5.5X45 (Screw) ×8 IMPLANT
SPACER SUSTAIN O SML 8X22 12MM (Spacer) ×4 IMPLANT
SPONGE GAUZE 4X4 12PLY (GAUZE/BANDAGES/DRESSINGS) ×2 IMPLANT
SPONGE LAP 4X18 X RAY DECT (DISPOSABLE) IMPLANT
SPONGE NEURO XRAY DETECT 1X3 (DISPOSABLE) IMPLANT
SPONGE SURGIFOAM ABS GEL 100 (HEMOSTASIS) ×2 IMPLANT
STRIP CLOSURE SKIN 1/2X4 (GAUZE/BANDAGES/DRESSINGS) ×2 IMPLANT
SUT VIC AB 1 CT1 18XBRD ANBCTR (SUTURE) ×1 IMPLANT
SUT VIC AB 1 CT1 8-18 (SUTURE) ×1
SUT VIC AB 2-0 CP2 18 (SUTURE) ×2 IMPLANT
SUT VIC AB 3-0 SH 8-18 (SUTURE) ×2 IMPLANT
SYR 20CC LL (SYRINGE) IMPLANT
SYR 20ML ECCENTRIC (SYRINGE) ×2 IMPLANT
SYR 5ML LL (SYRINGE) IMPLANT
TOWEL OR 17X24 6PK STRL BLUE (TOWEL DISPOSABLE) ×2 IMPLANT
TOWEL OR 17X26 10 PK STRL BLUE (TOWEL DISPOSABLE) ×2 IMPLANT
TRAY FOLEY CATH 14FRSI W/METER (CATHETERS) ×2 IMPLANT
WATER STERILE IRR 1000ML POUR (IV SOLUTION) ×2 IMPLANT

## 2012-10-26 NOTE — Progress Notes (Signed)
Doing well. Spoke whit husband

## 2012-10-26 NOTE — Transfer of Care (Signed)
Immediate Anesthesia Transfer of Care Note  Patient: Nicole Casey  Procedure(s) Performed: Procedure(s) with comments: Lumbar two-three and Lumbar three-four laminectomies with Lumbar four-five Posterior Lumbar Interbody Fusion (N/A) - Lumbar two-three and Lumbar three-four laminectomies with Lumbar four-five Posterior Lumbar Interbody Fusion  Patient Location: PACU  Anesthesia Type:General  Level of Consciousness: awake, alert , oriented and patient cooperative  Airway & Oxygen Therapy: Patient Spontanous Breathing and Patient connected to nasal cannula oxygen  Post-op Assessment: Report given to PACU RN, Post -op Vital signs reviewed and stable and Patient moving all extremities X 4  Post vital signs: Reviewed and stable  Complications: No apparent anesthesia complications

## 2012-10-26 NOTE — H&P (Signed)
Nicole Casey is an 57 y.o. female.   Chief Complaint: LBP HPI: patient seen because lbp with radiartn to both legs, right worse than left for several years. Seen by ortho ,she came to see [place] because of no better. Had a lumbar myelogram  Past Medical History  Diagnosis Date  . BRCA gene positive   . Herniated lumbar disc without myelopathy     L 3-4-5  . PONV (postoperative nausea and vomiting)     developed rash with knee replacement unsure of cause   . Arthritis     Past Surgical History  Procedure Laterality Date  . Cervical spine surgery  1993    hernaited disc repair  . Total knee arthroplasty Right 2007  . Abdominal hysterectomy  2002    fibroids, DUB  . Tubal ligation Bilateral 1987  . Salpingoophorectomy Right 1988    secondary to cyst  . Salpingoophorectomy Left 09/2008    secondary to BRCA +  . Retinal detachment surgery Right 1993  . Joint replacement      right knee  . Foot surgery      let foot  . Colonoscopy    . Esophagogastroduodenoscopy      Family History  Problem Relation Age of Onset  . Ovarian cancer Paternal Grandmother   . Breast cancer Cousin     paternal  . Ovarian cancer Cousin     paternal  . Cervical cancer Cousin     paternal  . Breast cancer Cousin     paternal  . Leukemia Daughter    Social History:  reports that she quit smoking about 36 years ago. Her smoking use included Cigarettes. She has a 2 pack-year smoking history. She has never used smokeless tobacco. She reports that she drinks about 2.4 ounces of alcohol per week. She reports that she does not use illicit drugs.  Allergies: No Known Allergies  Medications Prior to Admission  Medication Sig Dispense Refill  . acetaminophen (TYLENOL) 500 MG tablet Take 1,000 mg by mouth 2 (two) times daily as needed for pain.      . Naproxen Sodium (ALEVE) 220 MG CAPS Take 1 capsule by mouth daily.      . Vitamin D, Ergocalciferol, (DRISDOL) 50000 UNITS CAPS capsule Take 1 capsule  (50,000 Units total) by mouth every 7 (seven) days.  30 capsule  2    No results found for this or any previous visit (from the past 48 hour(s)). No results found.  Review of Systems  Constitutional: Negative.   HENT: Negative.   Eyes: Negative.   Respiratory: Negative.   Cardiovascular: Negative.   Gastrointestinal: Negative.   Genitourinary: Negative.   Musculoskeletal: Positive for back pain.  Skin: Negative.   Neurological: Positive for focal weakness.  Endo/Heme/Allergies: Negative.   Psychiatric/Behavioral: Negative.     Blood pressure 130/61, pulse 69, temperature 97 F (36.1 C), temperature source Oral, resp. rate 20, SpO2 100.00%. Physical Exam hent, nl. Neck, nl. Cv, nl. Lungs, clear. Abdomen, soft. Extremities, nl. NEURO WEAKNESS OF df BOTH FEET. DTR, NL. SENSORY, NL. 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 PATIENT AND HUSBAND ARE AWARE OF RISKS AND BENEFITS  Topaz Raglin M 10/26/2012, 8:33 AM

## 2012-10-26 NOTE — Progress Notes (Signed)
UR COMPLETED  

## 2012-10-26 NOTE — Anesthesia Preprocedure Evaluation (Signed)
Anesthesia Evaluation  Patient identified by MRN, date of birth, ID band Patient awake    Reviewed: Allergy & Precautions, H&P , NPO status , Patient's Chart, lab work & pertinent test results  History of Anesthesia Complications (+) PONV  Airway Mallampati: I TM Distance: >3 FB Neck ROM: Full    Dental  (+) Dental Advisory Given and Teeth Intact   Pulmonary former smoker,  breath sounds clear to auscultation  Pulmonary exam normal       Cardiovascular negative cardio ROS  Rhythm:Regular Rate:Normal     Neuro/Psych Back pain    GI/Hepatic negative GI ROS, Neg liver ROS,   Endo/Other  negative endocrine ROS  Renal/GU negative Renal ROS     Musculoskeletal   Abdominal   Peds  Hematology negative hematology ROS (+)   Anesthesia Other Findings   Reproductive/Obstetrics                           Anesthesia Physical Anesthesia Plan  ASA: I  Anesthesia Plan: General   Post-op Pain Management:    Induction: Intravenous  Airway Management Planned: Oral ETT  Additional Equipment:   Intra-op Plan:   Post-operative Plan: Extubation in OR  Informed Consent: I have reviewed the patients History and Physical, chart, labs and discussed the procedure including the risks, benefits and alternatives for the proposed anesthesia with the patient or authorized representative who has indicated his/her understanding and acceptance.   Dental advisory given  Plan Discussed with: CRNA and Surgeon  Anesthesia Plan Comments: (Plan routine monitors, GETA)        Anesthesia Quick Evaluation

## 2012-10-26 NOTE — Anesthesia Postprocedure Evaluation (Signed)
  Anesthesia Post-op Note  Patient: Nicole Casey  Procedure(s) Performed: Procedure(s) with comments: Lumbar two-three and Lumbar three-four laminectomies with Lumbar four-five Posterior Lumbar Interbody Fusion (N/A) - Lumbar two-three and Lumbar three-four laminectomies with Lumbar four-five Posterior Lumbar Interbody Fusion  Patient Location: PACU  Anesthesia Type:General  Level of Consciousness: awake, alert , oriented and patient cooperative  Airway and Oxygen Therapy: Patient Spontanous Breathing and Patient connected to nasal cannula oxygen  Post-op Pain: mild  Post-op Assessment: Post-op Vital signs reviewed, Patient's Cardiovascular Status Stable, Respiratory Function Stable, Patent Airway, No signs of Nausea or vomiting and Pain level controlled  Post-op Vital Signs: Reviewed and stable  Complications: No apparent anesthesia complications

## 2012-10-27 MED ORDER — GLYCERIN (LAXATIVE) 2.1 G RE SUPP
1.0000 | RECTAL | Status: DC | PRN
  Administered 2012-10-27: 1 via RECTAL
  Filled 2012-10-27: qty 1

## 2012-10-27 MED ORDER — DOCUSATE SODIUM 100 MG PO CAPS
100.0000 mg | ORAL_CAPSULE | Freq: Two times a day (BID) | ORAL | Status: DC | PRN
  Administered 2012-10-27 – 2012-10-30 (×5): 100 mg via ORAL
  Filled 2012-10-27 (×4): qty 1

## 2012-10-27 NOTE — Evaluation (Signed)
Occupational Therapy Evaluation Patient Details Name: Nicole Casey MRN: [id number] DOB: 09/13/1955 Today's Date: 10/27/2012 Time: 1026-1053 (minus 5 minutes due to over lay with PT session) OT Time Calculation (min): 27 min  OT Assessment / Plan / Recommendation History of present illness 57 yo female s/p L2-5 laminectomy   Clinical Impression   Patient is s/p L2-5 laminectomy  surgery resulting in functional limitations due to the deficits listed below (see OT problem list).  Patient will benefit from skilled OT acutely to increase independence and safety with ADLS to allow discharge return to home.     OT Assessment  Patient needs continued OT Services    Follow Up Recommendations  No OT follow up    Barriers to Discharge      Equipment Recommendations  None recommended by OT    Recommendations for Other Services    Frequency  Min 2X/week    Precautions / Restrictions Precautions Precautions: Back Precaution Booklet Issued: Yes (comment) Precaution Comments: back precautions and lifting restrictions reviewed Required Braces or Orthoses: Spinal Brace Spinal Brace: Lumbar corset;Other (comment) (when OOB, ok to get up without it.  )   Pertinent Vitals/Pain Nausea  Pt educated on oral medications and unsure if ready to d/c PCA use for oral medication     ADL  Eating/Feeding: Modified independent Where Assessed - Eating/Feeding: Chair Grooming: Wash/dry hands;Teeth care;Supervision/safety Where Assessed - Grooming: Unsupported standing Lower Body Dressing: Moderate assistance (unable to cross RT LE at this time) Where Assessed - Lower Body Dressing: Unsupported sitting Toilet Transfer: Supervision/safety Toilet Transfer Method: Sit to stand Toilet Transfer Equipment: Regular height toilet Toileting - Clothing Manipulation and Hygiene: Supervision/safety Where Assessed - Toileting Clothing Manipulation and Hygiene: Sit to stand from 3-in-1 or toilet Equipment Used:  Gait belt;Rolling walker Transfers/Ambulation Related to ADLs: Pt ambulating with RW supervision level ADL Comments: Pt educated on back precautions with adls and next session to focus on LB dressing /shower transfer. Pt has to climb full flight of steps for all bathrooms. Pt educated on bed mobility with good return demo. Pt progressing well for evaluation.     OT Diagnosis: Generalized weakness;Acute pain  OT Problem List: Decreased strength;Decreased activity tolerance;Impaired balance (sitting and/or standing);Pain;Decreased knowledge of use of DME or AE OT Treatment Interventions: Self-care/ADL training;Therapeutic exercise;DME and/or AE instruction;Therapeutic activities;Patient/family education;Balance training   OT Goals(Current goals can be found in the care plan section) Acute Rehab OT Goals Patient Stated Goal: to get back home, decrease back pain OT Goal Formulation: With patient Time For Goal Achievement: 11/10/12 Potential to Achieve Goals: Good ADL Goals Pt Will Perform Lower Body Dressing: with modified independence;with adaptive equipment;sit to/from stand Pt Will Perform Tub/Shower Transfer: Shower transfer;with modified independence;rolling walker;ambulating  Visit Information  Last OT Received On: 10/27/12 Assistance Needed: +1 History of Present Illness: 57 yo female s/p L2-5 laminectomy       Prior Functioning     Home Living Family/patient expects to be discharged to:: Private residence Living Arrangements: Spouse/significant other Available Help at Discharge: Family;Available 24 hours/day Type of Home: House Home Access: Stairs to enter [employer] of Steps: 2 Entrance Stairs-Rails: Right Home Layout: Two level Alternate Level Stairs-Number of Steps: flight Alternate Level Stairs-Rails: Right Home Equipment: None Additional Comments: pt with h/o TKA Prior Function Level of Independence: Independent Comments: Marketing executive,  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 Communication: No difficulties Dominant Hand: Right         Vision/Perception Vision - History Baseline Vision:  No visual deficits Patient Visual Report: No change from baseline   Cognition  Cognition Arousal/Alertness: Awake/alert Behavior During Therapy: WFL for tasks assessed/performed Overall Cognitive Status: Within Functional Limits for tasks assessed    Extremity/Trunk Assessment Upper Extremity Assessment Upper Extremity Assessment: Overall WFL for tasks assessed Lower Extremity Assessment Lower Extremity Assessment: Defer to PT evaluation Cervical / Trunk Assessment Cervical / Trunk Assessment: Normal     Mobility Bed Mobility Bed Mobility: Rolling Right;Right Sidelying to Sit;Supine to Sit;Sitting - Scoot to Edge of Bed Rolling Right: With rail;5: Supervision Right Sidelying to Sit: With rails;4: Min guard;HOB flat Supine to Sit: 4: Min assist;With rails;HOB flat Sitting - Scoot to Edge of Bed: 5: Supervision Details for Bed Mobility Assistance: pt educated on sequence and able to follow sequence for good return demo using bed rail. Next session to focus on removal of rail and getting up from flat bed surface to simulate home environment.  Transfers Transfers: Sit to Stand;Stand to Sit Sit to Stand: 4: Min guard;With upper extremity assist;From bed Stand to Sit: 4: Min guard;With upper extremity assist;To chair/3-in-1 Details for Transfer Assistance: min guard assist for safety due to POD #1.       Exercise     Balance Balance Balance Assessed: Yes Static Sitting Balance Static Sitting - Balance Support: Bilateral upper extremity supported;Feet supported Static Sitting - Level of Assistance: 6: Modified independent (Device/Increase time) Static Standing Balance Static Standing - Balance Support: Bilateral upper extremity supported Static Standing - Level of Assistance: 5: Stand by assistance   End of Session OT - End  of Session Activity Tolerance: Patient tolerated treatment well Patient left: in chair;with call bell/phone within reach Nurse Communication: Mobility status;Precautions  GO     Harolyn Rutherford 10/27/2012, 11:14 AM Pager: [phone]

## 2012-10-27 NOTE — Progress Notes (Signed)
Orthopedic Tech Progress Note Patient Details:  Nicole Casey 1955-09-07 [id number]  Patient ID: Nicole Casey, female   DOB: [birth year], 57 y.o.   MRN: [id number]   Nicole Casey 10/27/2012, 10:44 [facility] bio-tech for lumbar corsett.

## 2012-10-27 NOTE — Op Note (Signed)
NAME:  Nicole Casey, Nicole Casey NO.:  [id number]  MEDICAL RECORD NO.:  [id number]  LOCATION:  [id number]                        FACILITY:  [facility]  PHYSICIAN:  Hilda Lias, M.D.   DATE OF BIRTH:  January 06, 1956  DATE OF PROCEDURE:  10/26/2012 DATE OF DISCHARGE:                              OPERATIVE REPORT   PREOPERATIVE DIAGNOSIS:  Lumbar stenosis L2-3, L3-4, L4-5 with a grade 1 and grade 2 spondylolisthesis at the level of L4-5.  Neurogenic claudication.  Chronic lumbar radiculopathy.  POSTOPERATIVE DIAGNOSIS:  Lumbar stenosis L2-3, L3-4, L4-5 with a grade 1 and grade 2 spondylolisthesis at the level of L4-5. Neurogenic claudication.  Chronic lumbar radiculopathy.  PROCEDURE:  A 3/4 laminotomy of L2, bilateral laminectomy of L3, bilateral laminectomy of L4 with facetectomy. Bilateral L4-5 diskectomy beyond normal to allow [place] to introduce 2 cages of 12 x 22.  Pedicle screws L4-L5.  Posterolateral arthrodesis, L4-5.  Cell Saver, C-arm.  SURGEON:  Hilda Lias, M.D.  ASSISTANT:  Dr. Ellwood Handler.  CLINICAL HISTORY:  The patient was in my office because of back pain radiating to both lower extremities associated with weakness.  The patient gets worse with walking, better with sitting.  X-rays show severe stenosis at the level of L4-5 with spondylolisthesis, severe stenosis at L3-4, moderate at L2-3.  Surgery was advised.  DESCRIPTION OF PROCEDURE:  The patient was taken to the OR.  After intubation, she was positioned in a prone manner.  The back was cleaned with Betadine and later on with DuraPrep.  Then, drapes were applied. Midline incision from L1-2 down to L4-5 was made and muscle retracted laterally.  We were able to feel and see the lateral aspect of the facet of L4-5.  X-rays showed that the clips were at the level of L3-4.  From then on, we proceeded with removal of spinous process of L3-4, partial of L2.  Laminectomy at the level of L3 was made with decompression  of the thecal sac as well as the foramen for the L2-L3 nerve root.  At the level of L2 with a 3/4 laminotomy with decompression of the thecal sac and foramen.  At the level of L4-5, we were able to go laterally to remove the facet and entered the disk space at L4-5 on the right side with incision and total diskectomy medial and laterally.  The same procedure was done on the left side.  The diskectomy was done more than normal.  After that, we removed the endplate.  We introduced 2 cages of 12 x 22 with BMP and autograft.  Then, using the C-arm in AP view and the lateral view, we made a hole in the pedicle of L4-L5.  Prior to introduction of the pedicle, we feel the hole just to be sure that we were surrounded by bone.  Then, 4 screws of 5.5 x 45 were inserted followed by a rod and caps.  Then, we went laterally and we removed the periosteum of the facet and the transverse process of L4-5.  A mix of autograft and BMP was used for arthrodesis.  From then on, the area was irrigated. Valsalva maneuver was negative.  A Hemovac was left in the epidural  space.  The wound was closed with Vicryl and Steri-Strips.          ______________________________ Hilda Lias, M.D.     EB/MEDQ  D:  10/26/2012  T:  10/27/2012  Job:  [id number]

## 2012-10-27 NOTE — Progress Notes (Signed)
Patient ambulated with RN in the hallway. Tolerated well with walker. No brace ( not fitted for one and no order in place for brace). Foley d/cd, due to void. Hemovac emptied 180 ml.   [name]

## 2012-10-27 NOTE — Progress Notes (Signed)
Nutrition Brief Note  Patient identified on the Malnutrition Screening Tool (MST) Report for recent weight lost without trying (patient unsure).  Per readings below, patient's weight has been trending up.  Wt Readings from Last 15 Encounters:  10/26/12 182 lb (82.555 kg)  10/26/12 182 lb (82.555 kg)  10/14/12 174 lb 8 oz (79.153 kg)  09/26/12 173 lb (78.472 kg)    Body mass index is 29.39 kg/(m^2). Patient meets criteria for Overweight based on current BMI.   Current diet order is Regular.  Labs and medications reviewed.   No nutrition interventions warranted at this time. If nutrition issues arise, please consult RD.   Maureen Chatters, RD, LDN Pager #: [phone] After-Hours Pager #: [phone]

## 2012-10-27 NOTE — Evaluation (Signed)
Physical Therapy Evaluation Patient Details Name: Nicole Casey MRN: [id number] DOB: [birth year] Today's Date: 10/27/2012 Time: 7829-5621 PT Time Calculation (min): 16 min  PT Assessment / Plan / Recommendation History of Present Illness  57 yo female s/p L2-5 laminectomy  Clinical Impression  Pt is POD #1 and is moving well with some radiating pain into bil legs, but "100% better than before surgery".  Pt is already at min guard to supervision level and should do well at home at discharge.  I do not anticipate that she will need HH therapy f/u as long as she looks good on the stairs (her bedroom is up a flight of stairs).  All education will be completed acutely.      PT Assessment  Patient needs continued PT services    Follow Up Recommendations  No PT follow up;Supervision - Intermittent    Does the patient have the potential to tolerate intense rehabilitation     Yes  Barriers to Discharge Other (comment) (None) None    Equipment Recommendations  Rolling walker with 5" wheels    Recommendations for Other Services Other (comment) (None)   Frequency Min 5X/week    Precautions / Restrictions Precautions Precautions: Back Precaution Booklet Issued: Yes (comment) Precaution Comments: back precautions and lifting restrictions reviewed Required Braces or Orthoses: Spinal Brace Spinal Brace: Lumbar corset;Other (comment) (when OOB, ok to get up without it.  )   Pertinent Vitals/Pain See vitals flow sheet.       Mobility  Bed Mobility Bed Mobility: Rolling Right;Right Sidelying to Sit;Supine to Sit;Sitting - Scoot to Edge of Bed Rolling Right: With rail;5: Supervision Right Sidelying to Sit: With rails;4: Min guard;HOB flat Supine to Sit: 4: Min assist;With rails;HOB flat Sitting - Scoot to Edge of Bed: 5: Supervision Details for Bed Mobility Assistance: pt educated on sequence and able to follow sequence for good return demo using bed rail. Next session to focus on  removal of rail and getting up from flat bed surface to simulate home environment.  Transfers Sit to Stand: 4: Min guard;With upper extremity assist;From bed Details for Transfer Assistance: min guard assist for safety due to POD #1.   Ambulation/Gait Ambulation/Gait Assistance: 5: Supervision Ambulation Distance (Feet): 150 Feet Assistive device: Rolling walker Ambulation/Gait Assistance Details: supervision for safety due to pain in legs and bil gastrocs Gait Pattern: Step-through pattern;Shuffle;Trunk flexed        PT Diagnosis: Difficulty walking;Abnormality of gait;Generalized weakness;Acute pain  PT Problem List: Decreased strength;Decreased activity tolerance;Decreased balance;Decreased mobility;Decreased knowledge of use of DME;Decreased knowledge of precautions;Pain PT Treatment Interventions: DME instruction;Gait training;Stair training;Functional mobility training;Therapeutic activities;Therapeutic exercise;Balance training;Neuromuscular re-education;Patient/family education;Modalities     PT Goals(Current goals can be found in the care plan section) Acute Rehab PT Goals Patient Stated Goal: to get back home, decrease back pain PT Goal Formulation: With patient Time For Goal Achievement: 11/03/12 Potential to Achieve Goals: Good  Visit Information  Last PT Received On: 10/27/12 Assistance Needed: +1 PT/OT Co-Evaluation/Treatment: Yes (partial overlap "tag team") History of Present Illness: 57 yo female s/p L2-5 laminectomy       Prior Functioning  Home Living Family/patient expects to be discharged to:: Private residence Living Arrangements: Spouse/significant other Available Help at Discharge: Family;Available 24 hours/day Type of Home: House Home Access: Stairs to enter [employer] of Steps: 2 Entrance Stairs-Rails: Right Home Layout: Two level Alternate Level Stairs-Number of Steps: flight Alternate Level Stairs-Rails: Right Home Equipment:  None Additional Comments: pt with h/o TKA Prior Function Level  of Independence: Independent Comments: Marketing executive, 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 Dominant Hand: Right    Cognition  Cognition Arousal/Alertness: Awake/alert Behavior During Therapy: WFL for tasks assessed/performed Overall Cognitive Status: Within Functional Limits for tasks assessed    Extremity/Trunk Assessment Upper Extremity Assessment Upper Extremity Assessment: Defer to OT evaluation Lower Extremity Assessment Lower Extremity Assessment: Generalized weakness (due to pain radiating into bil legs) Cervical / Trunk Assessment Cervical / Trunk Assessment: Normal   Balance Balance Balance Assessed: Yes Static Sitting Balance Static Sitting - Balance Support: Bilateral upper extremity supported;Feet supported Static Sitting - Level of Assistance: 6: Modified independent (Device/Increase time) Static Standing Balance Static Standing - Balance Support: Bilateral upper extremity supported Static Standing - Level of Assistance: 5: Stand by assistance  End of Session PT - End of Session Equipment Utilized During Treatment: Gait belt (back brace has been ordered, but not in room yet) Activity Tolerance: Patient limited by pain Patient left: Other (comment) (with OT going to the bathroom)    Lurena Joiner B. Norman Piacentini, PT, DPT [phone]   10/27/2012, 10:42 AM

## 2012-10-27 NOTE — Progress Notes (Signed)
Patient ID: Nicole Casey, female   DOB: [birth year], 57 y.o.   MRN: [id number] Doing well, incisional pain, no weakness. Continue pca till am.

## 2012-10-28 MED ORDER — FLEET ENEMA 7-19 GM/118ML RE ENEM
1.0000 | ENEMA | Freq: Every day | RECTAL | Status: DC | PRN
  Administered 2012-10-28: 1 via RECTAL
  Filled 2012-10-28: qty 1

## 2012-10-28 MED ORDER — OXYCODONE HCL 5 MG PO TABS
15.0000 mg | ORAL_TABLET | ORAL | Status: DC | PRN
  Administered 2012-10-29 – 2012-10-30 (×3): 15 mg via ORAL
  Filled 2012-10-28 (×3): qty 3

## 2012-10-28 MED ORDER — MORPHINE SULFATE 15 MG PO TABS: 15.0000 mg | ORAL_TABLET | ORAL | Status: DC | PRN

## 2012-10-28 MED FILL — Heparin Sodium (Porcine) Inj 1000 Unit/ML: INTRAMUSCULAR | Qty: 30 | Status: AC

## 2012-10-28 MED FILL — Sodium Chloride IV Soln 0.9%: INTRAVENOUS | Qty: 2000 | Status: AC

## 2012-10-28 NOTE — Progress Notes (Signed)
Pt ambulated in the hallway twice with RN. Tolerated very well with brace and walker. Hemovac drained 50 ml.

## 2012-10-28 NOTE — Progress Notes (Signed)
Occupational Therapy Treatment Patient Details Name: Nicole Casey MRN: [id number] DOB: 06/15/1955 Today's Date: 10/28/2012 Time: 9528-4132 OT Time Calculation (min): 31 min  OT Assessment / Plan / Recommendation  History of present illness 57 yo female s/p L2-5 laminectomy   OT comments  All education is complete and patient indicates understanding. PT is at adequate level for d/c home  Follow Up Recommendations  No OT follow up    Barriers to Discharge       Equipment Recommendations  None recommended by OT    Recommendations for Other Services    Frequency Min 2X/week   Progress towards OT Goals Progress towards OT goals: Progressing toward goals  Plan Discharge plan remains appropriate    Precautions / Restrictions Precautions Precautions: Back Precaution Comments: husband present this session and shown handout Required Braces or Orthoses: Spinal Brace Spinal Brace: Lumbar corset;Other (comment) Restrictions Weight Bearing Restrictions: No   Pertinent Vitals/Pain Minimal pain    ADL  Grooming: Teeth care;Modified independent Where Assessed - Grooming: Unsupported standing Upper Body Bathing: Chest;Right arm;Left arm;Abdomen;Modified independent Where Assessed - Upper Body Bathing: Unsupported sitting Lower Body Bathing: Modified independent Where Assessed - Lower Body Bathing: Unsupported sitting Upper Body Dressing: Modified independent Where Assessed - Upper Body Dressing: Unsupported sitting Lower Body Dressing: Supervision/safety Where Assessed - Lower Body Dressing: Unsupported sit to stand Toilet Transfer: Modified independent Toilet Transfer Method: Sit to Barista: Regular height toilet Toileting - Clothing Manipulation and Hygiene: Modified independent Where Assessed - Engineer, mining and Hygiene: Sit to stand from 3-in-1 or toilet Tub/Shower Transfer: Supervision/safety Tub/Shower Transfer Method:  Science writer: Walk in Scientist, research (physical sciences) Used: Gait belt;Back brace Transfers/Ambulation Related to ADLs: pt ambulating without DME and mod i ADL Comments: Pt complete full shower and educated on don /doff brace. Pt educated on drying off and positioning of objects on right to prevent twisting. pt completed grooming task at sink.      OT Diagnosis:    OT Problem List:   OT Treatment Interventions:     OT Goals(current goals can now be found in the care plan section) Acute Rehab OT Goals Patient Stated Goal: to get back home, decrease back pain OT Goal Formulation: With patient Time For Goal Achievement: 11/10/12 Potential to Achieve Goals: Good ADL Goals Pt Will Perform Lower Body Dressing: with modified independence;with adaptive equipment;sit to/from stand Pt Will Perform Tub/Shower Transfer: Shower transfer;with modified independence;rolling walker;ambulating  Visit Information  Last OT Received On: 10/28/12 Assistance Needed: +1 History of Present Illness: 57 yo female s/p L2-5 laminectomy    Subjective Data      Prior Functioning       Cognition  Cognition Arousal/Alertness: Awake/alert Behavior During Therapy: WFL for tasks assessed/performed Overall Cognitive Status: Within Functional Limits for tasks assessed    Mobility  Bed Mobility Bed Mobility: Not assessed Transfers Transfers: Sit to Stand;Stand to Sit Sit to Stand: 5: Supervision;From chair/3-in-1 Stand to Sit: 5: Supervision;With upper extremity assist;To chair/3-in-1    Exercises      Balance     End of Session OT - End of Session Activity Tolerance: Patient tolerated treatment well Patient left: Other (comment) (with PT [name]) Nurse Communication: Mobility status;Precautions  GO     Nicole Casey 10/28/2012, 3:44 PM Pager: [phone]

## 2012-10-28 NOTE — Progress Notes (Signed)
Pt c/o discomfort at IV site, IV was D/C, RN attempted to place another IV but was unsuccessful. Pt doesn't want another IV inserted at this time.

## 2012-10-28 NOTE — Progress Notes (Signed)
Patient ID: Nicole Casey, female   DOB: Jul 16, 1955, 57 y.o.   MRN: [id number] C/o comstipation. Pain better. No weakness.see orders

## 2012-10-28 NOTE — Progress Notes (Signed)
Physical Therapy Treatment Patient Details Name: Nicole Casey MRN: [id number] DOB: 11-May-1955 Today's Date: 10/28/2012 Time: 8295-6213 PT Time Calculation (min): 15 min  PT Assessment / Plan / Recommendation  History of Present Illness 57 yo female s/p L2-5 laminectomy   PT Comments   Pt is POD #2 and is moving well even without RW.  She demonstrated good strength on stairs.  She no longer needs RW for d/c.  Pt to check on tomorrow to reinforce precautions and mobility if needed before d/c.    Follow Up Recommendations  No PT follow up;Supervision - Intermittent      Does the patient have the potential to tolerate intense rehabilitation    Yes  Barriers to Discharge   None      Equipment Recommendations  None recommended by PT    Recommendations for Other Services Other (comment) (None)  Frequency Min 5X/week   Progress towards PT Goals Progress towards PT goals: Progressing toward goals  Plan Current plan remains appropriate    Precautions / Restrictions Precautions Precautions: Back Precaution Comments: husband present this session and shown handout Required Braces or Orthoses: Spinal Brace Spinal Brace: Lumbar corset;Other (comment) Spinal Brace Comments: when OOB, ok to get up without it.  Restrictions Weight Bearing Restrictions: No   Pertinent Vitals/Pain See vitals flow sheet.     Mobility  Bed Mobility Bed Mobility: Not assessed Sit to Sidelying Right: 6: Modified independent (Device/Increase time);HOB flat (no rail) Details for Bed Mobility Assistance: pt able to with much efforr get bil knees and legs onto side of the bed using correct technique.  Extra time needed due to pain.  Transfers Sit to Stand: 5: Supervision;From chair/3-in-1 Stand to Sit: 6: Modified independent (Device/Increase time);With upper extremity assist;With armrests;To bed Details for Transfer Assistance: used hands to lower slowly.  Ambulation/Gait Ambulation/Gait Assistance: 6:  Modified independent (Device/Increase time) Ambulation Distance (Feet): 450 Feet Assistive device: None Ambulation/Gait Assistance Details: mod I due to slow gait speed.  R leg midly antalgic gait pattern due to pain and weakness in right leg.   Gait Pattern: Step-through pattern;Antalgic Gait velocity: slowed, but steady Stairs: Yes Stairs Assistance: 5: Supervision Stairs Assistance Details (indicate cue type and reason): verbal cues for safest technique and leg sequencing.   Stair Management Technique: One rail Left;Alternating pattern;Step to pattern;Forwards (alt up, step to down) Number of Stairs: 9      PT Goals (current goals can now be found in the care plan section) Acute Rehab PT Goals Patient Stated Goal: to get back home, decrease back pain  Visit Information  Last PT Received On: 10/28/12 Assistance Needed: +1 History of Present Illness: 57 yo female s/p L2-5 laminectomy    Subjective Data  Subjective: Pt walking with OT and would like to be cleared to walk on her own in the hallway.   Patient Stated Goal: to get back home, decrease back pain   Cognition  Cognition Arousal/Alertness: Awake/alert Behavior During Therapy: WFL for tasks assessed/performed Overall Cognitive Status: Within Functional Limits for tasks assessed       End of Session PT - End of Session Equipment Utilized During Treatment: Back brace Activity Tolerance: Patient tolerated treatment well Patient left: in bed;with call bell/phone within reach Nurse Communication: Mobility status (ok for pt to move about the room and walk in hallway I)     Ethel Meisenheimer B. Markavious Micco, PT, DPT [phone]   10/28/2012, 4:13 PM

## 2012-10-28 NOTE — Progress Notes (Signed)
Occupational Therapy Treatment Patient Details Name: Nicole Casey MRN: [id number] DOB: 1955-06-25 Today's Date: 10/28/2012 Time: 9528-4132 OT Time Calculation (min): 29 min  OT Assessment / Plan / Recommendation  History of present illness 57 yo female s/p L2-5 laminectomy   OT comments  Pt currently with Hemovac preventing shower with OT at this time. RN contacting MD to request d/c. OT to reattempt this PM. Pt requesting to ambulate without RW.   Follow Up Recommendations  No OT follow up    Barriers to Discharge       Equipment Recommendations  None recommended by OT    Recommendations for Other Services    Frequency Min 2X/week   Progress towards OT Goals Progress towards OT goals: Progressing toward goals  Plan Discharge plan remains appropriate    Precautions / Restrictions Precautions Precautions: Back Precaution Comments: husband present this session and shown handout Required Braces or Orthoses: Spinal Brace Spinal Brace: Lumbar corset;Other (comment)   Pertinent Vitals/Pain Minimal pain    ADL  Toilet Transfer: Modified independent Toilet Transfer Method: Sit to stand Toilet Transfer Equipment: Regular height toilet Toileting - Clothing Manipulation and Hygiene: Modified independent Where Assessed - Toileting Clothing Manipulation and Hygiene: Sit to stand from 3-in-1 or toilet Tub/Shower Transfer: Supervision/safety Tub/Shower Transfer Method: Science writer: Walk in shower (educated on shower transfer with RW) Equipment Used: Gait belt;Rolling walker;Back brace Transfers/Ambulation Related to ADLs: pt ambulating to bathroom supervision without RW. Pt states "i hate that thing" ADL Comments: Pt educated on shower transfer and LB dressing with AE. husband educated on where to buy patient AE. Pt with good return demo. Pt wanting to shower and with order. PT however with hemavac in place and unable to shower until d/c by RN. OT to  reattempt shower this PM>    OT Diagnosis:    OT Problem List:   OT Treatment Interventions:     OT Goals(current goals can now be found in the care plan section) Acute Rehab OT Goals Patient Stated Goal: to get back home, decrease back pain OT Goal Formulation: With patient Time For Goal Achievement: 11/10/12 Potential to Achieve Goals: Good ADL Goals Pt Will Perform Lower Body Dressing: with modified independence;with adaptive equipment;sit to/from stand Pt Will Perform Tub/Shower Transfer: Shower transfer;with modified independence;rolling walker;ambulating  Visit Information  Last OT Received On: 10/28/12 Assistance Needed: +1 History of Present Illness: 57 yo female s/p L2-5 laminectomy    Subjective Data      Prior Functioning       Cognition  Cognition Arousal/Alertness: Awake/alert Behavior During Therapy: WFL for tasks assessed/performed Overall Cognitive Status: Within Functional Limits for tasks assessed    Mobility  Bed Mobility Bed Mobility: Not assessed Transfers Transfers: Sit to Stand;Stand to Sit Sit to Stand: 5: Supervision;With upper extremity assist;From bed Stand to Sit: 5: Supervision;With upper extremity assist;To chair/3-in-1    Exercises      Balance     End of Session OT - End of Session Activity Tolerance: Patient tolerated treatment well Patient left: in chair;with call bell/phone within reach;with family/visitor present Nurse Communication: Mobility status;Precautions  GO     Harolyn Rutherford 10/28/2012, 1:22 PM Pager: [phone]

## 2012-10-29 ENCOUNTER — Inpatient Hospital Stay (HOSPITAL_COMMUNITY): Payer: BC Managed Care – PPO

## 2012-10-29 LAB — CBC WITH DIFFERENTIAL/PLATELET
Basophils Relative: 0 % (ref 0–1)
Eosinophils Absolute: 0.1 10*3/uL (ref 0.0–0.7)
Eosinophils Relative: 2 % (ref 0–5)
Lymphs Abs: 1.1 10*3/uL (ref 0.7–4.0)
MCH: 34 pg (ref 26.0–34.0)
MCHC: 34.7 g/dL (ref 30.0–36.0)
MCV: 98 fL (ref 78.0–100.0)
Monocytes Absolute: 0.6 10*3/uL (ref 0.1–1.0)
Neutrophils Relative %: 75 % (ref 43–77)
Platelets: 198 10*3/uL (ref 150–400)
RBC: 2.94 MIL/uL — ABNORMAL LOW (ref 3.87–5.11)

## 2012-10-29 MED ORDER — PSEUDOEPHEDRINE HCL 30 MG PO TABS
30.0000 mg | ORAL_TABLET | Freq: Three times a day (TID) | ORAL | Status: DC | PRN
  Administered 2012-10-30 (×2): 30 mg via ORAL
  Filled 2012-10-29 (×2): qty 1

## 2012-10-29 MED ORDER — FLUTICASONE PROPIONATE 50 MCG/ACT NA SUSP
1.0000 | Freq: Two times a day (BID) | NASAL | Status: DC | PRN
  Administered 2012-10-30: 1 via NASAL
  Filled 2012-10-29: qty 16

## 2012-10-29 NOTE — Progress Notes (Signed)
Patient ID: Nicole Casey, female   DOB: [birth year], 57 y.o.   MRN: [id number] Stable, walking much better. Wound dry.continue pt. Dc in am?

## 2012-10-29 NOTE — Progress Notes (Signed)
Physical Therapy Treatment Patient Details Name: Nicole Casey MRN: [id number] DOB: 1955/03/04 Today's Date: 10/29/2012 Time: 4098-1191 PT Time Calculation (min): 27 min  PT Assessment / Plan / Recommendation  History of Present Illness 57 yo female s/p L2-5 laminectomy   PT Comments   Pt progressing well towards physical therapy goals. Pt with many questions about maintaining back precautions during activity and mobility when she returns home. Pt continues to require some VC's to refine technique for back precautions, but overall pt is doing very well and is safe during transfers and ambulation.  Follow Up Recommendations  No PT follow up;Supervision - Intermittent     Does the patient have the potential to tolerate intense rehabilitation     Barriers to Discharge        Equipment Recommendations  None recommended by PT    Recommendations for Other Services    Frequency Min 5X/week   Progress towards PT Goals Progress towards PT goals: Progressing toward goals  Plan Current plan remains appropriate    Precautions / Restrictions Precautions Precautions: Back;Fall Required Braces or Orthoses: Spinal Brace Spinal Brace: Lumbar corset;Other (comment) Spinal Brace Comments: when OOB, ok to get up without it.  Restrictions Weight Bearing Restrictions: No   Pertinent Vitals/Pain Pt reports 4/10 pain in low back.    Mobility  Bed Mobility Bed Mobility: Rolling Right;Right Sidelying to Sit;Sitting - Scoot to [place] of Bed;Sit to Sidelying Right Rolling Right: 5: Supervision Right Sidelying to Sit: 5: Supervision;HOB flat Sitting - Scoot to Edge of Bed: 5: Supervision Sit to Sidelying Right: 6: Modified independent (Device/Increase time) Details for Bed Mobility Assistance: Pt continues to require cueing for sequencing and technique to maintain back precautions. With VC's pt is able to perform well with no use of bed rails and HOB flat. Transfers Transfers: Sit to Stand;Stand  to Sit Sit to Stand: 6: Modified independent (Device/Increase time);With upper extremity assist;From bed Stand to Sit: 6: Modified independent (Device/Increase time);With upper extremity assist;To bed Details for Transfer Assistance: VC's to maintain back precautions. Pt practiced stand>sit x2 for proper form. Ambulation/Gait Ambulation/Gait Assistance: 6: Modified independent (Device/Increase time) Ambulation Distance (Feet): 400 Feet Assistive device: None Ambulation/Gait Assistance Details: VC's for improved posture during gait training. Pt ambulating slowly and continues to demonstrate mild antalgic gait pattern due to R LE weakness and pain. Gait Pattern: Step-through pattern;Antalgic Gait velocity: slowed, but steady Stairs: No    Exercises     PT Diagnosis:    PT Problem List:   PT Treatment Interventions:     PT Goals (current goals can now be found in the care plan section) Acute Rehab PT Goals Patient Stated Goal: to get back home, decrease back pain PT Goal Formulation: With patient Time For Goal Achievement: 11/03/12 Potential to Achieve Goals: Good  Visit Information  Last PT Received On: 10/29/12 Assistance Needed: +1 History of Present Illness: 57 yo female s/p L2-5 laminectomy    Subjective Data  Subjective: "I have walked twice in the hallway already." Patient Stated Goal: to get back home, decrease back pain   Cognition  Cognition Arousal/Alertness: Awake/alert Behavior During Therapy: WFL for tasks assessed/performed Overall Cognitive Status: Within Functional Limits for tasks assessed    Balance  Balance Balance Assessed: Yes Static Sitting Balance Static Sitting - Balance Support: No upper extremity supported;Feet supported Static Sitting - Level of Assistance: 7: Independent Static Sitting - Comment/# of Minutes: 3 Static Standing Balance Static Standing - Balance Support: No upper extremity supported Static Standing -  Level of Assistance: 6:  Modified independent (Device/Increase time) Static Standing - Comment/# of Minutes: 2  End of Session PT - End of Session Equipment Utilized During Treatment: Back brace;Gait belt Activity Tolerance: Patient tolerated treatment well Patient left: in bed;with call bell/phone within reach Nurse Communication: Mobility status   GP     Ruthann Cancer 10/29/2012, 10:53 AM  Ruthann Cancer, PT, DPT Acute Rehabilitation Services

## 2012-10-30 NOTE — Progress Notes (Signed)
Patient ID: Nicole Casey, female   DOB: 10-01-1955, 57 y.o.   MRN: [id number] Stable, decrease of temp with the use of IS. Wound dry. Cbc , wnl. Pain in legs better.wants to go home . Will check tem later on. At present no need of abs but continue with IS

## 2012-10-30 NOTE — Progress Notes (Signed)
Pt had a temp of 101.6 at 2014,c/o of sinus headache and congestion,Dr Jeral Fruit paged and notified,ordered CXR,CBC and to consult respiratory for the sinus congestion,resp staff (Charlse) paged,came up to see pt and suggested pt will benefit from a nasal spray,same related back to Dr Jeral Fruit who then ordered to give Flonase and sudafed, commenced at 0033 and 0032 respectively,tylenol tab 2 also given at 2209,pt reassured,will continue monitor. [name], Nicole Casey

## 2012-10-30 NOTE — Discharge Summary (Signed)
Physician Discharge Summary  Patient ID: Nicole Casey MRN: [id number] DOB/AGE: Jul 02, 1955 57 y.o.  Admit date: 10/26/2012 Discharge date: 10/30/2012  Admission Diagnoses:lumbar spondylolisthesis, stenosis  Discharge Diagnoses: same Active Problems:   * No active hospital problems. *   Discharged Condition:less pain  Hospital Course: surgery  Consults: none  Significant Diagnostic Studies: myelogram  Treatments: lumbar decompression and fusion  Discharge Exam: Blood pressure 108/62, pulse 97, temperature 99 F (37.2 C), temperature source Oral, resp. rate 16, height 5\' 6"  (1.676 m), weight 82.555 kg (182 lb), SpO2 98.00%. Ambulating, no weakness.  Disposition: home   Future Appointments Provider Department Dept Phone   09/27/2013 9:45 AM Lauro Franklin, FNP [facility] [phone]       Medication List    ASK your doctor about these medications       acetaminophen 500 MG tablet  Commonly known as:  TYLENOL  Take 1,000 mg by mouth 2 (two) times daily as needed for pain.     ALEVE 220 MG Caps  Generic drug:  Naproxen Sodium  Take 1 capsule by mouth daily.     Vitamin D (Ergocalciferol) 50000 UNITS Caps capsule  Commonly known as:  DRISDOL  Take 1 capsule (50,000 Units total) by mouth every 7 (seven) days.         Signed: Karn Cassis 10/30/2012, 12:35 PM

## 2012-12-13 ENCOUNTER — Encounter: Payer: Self-pay | Admitting: Nurse Practitioner

## 2012-12-13 ENCOUNTER — Telehealth: Payer: Self-pay | Admitting: Nurse Practitioner

## 2013-04-07 ENCOUNTER — Ambulatory Visit: Payer: BC Managed Care – PPO | Admitting: Podiatrist

## 2013-04-21 ENCOUNTER — Ambulatory Visit (INDEPENDENT_AMBULATORY_CARE_PROVIDER_SITE_OTHER): Payer: BC Managed Care – PPO

## 2013-04-21 ENCOUNTER — Ambulatory Visit (INDEPENDENT_AMBULATORY_CARE_PROVIDER_SITE_OTHER): Payer: BC Managed Care – PPO | Admitting: Podiatrist

## 2013-04-21 ENCOUNTER — Encounter: Payer: Self-pay | Admitting: Podiatrist

## 2013-04-21 VITALS — BP 125/79 | HR 63 | Resp 16

## 2013-04-21 DIAGNOSIS — M19079 Primary osteoarthritis, unspecified ankle and foot: Secondary | ICD-10-CM

## 2013-04-21 DIAGNOSIS — G5790 Unspecified mononeuropathy of unspecified lower limb: Secondary | ICD-10-CM

## 2013-04-21 DIAGNOSIS — M79676 Pain in unspecified toe(s): Secondary | ICD-10-CM

## 2013-04-21 DIAGNOSIS — M79609 Pain in unspecified limb: Secondary | ICD-10-CM

## 2013-04-21 NOTE — Progress Notes (Signed)
  Chief Complaint  Patient presents with  . Toe Pain    2nd toe left "My toe feels numb and tingly all the time. Its the same toe I had surgery on"     HPI: Patient is 58 y.o. female who presents today for pain in the left second toe. The patient states she has pain and numbness at the tip of the toe is uncomfortable in shoes and going barefoot.     Physical Exam GENERAL APPEARANCE: Alert, conversant. Appropriately groomed. No acute distress.  VASCULAR: Pedal pulses palpable at 2/4 DP and PT bilateral.  Capillary refill time is immediate to all digits,  Proximal to distal cooling it warm to warm.  Digital hair growth is present bilateral  NEUROLOGIC: sensation is intact epicritically and protectively to 5.07 monofilament at 5/5 sites bilateral.  Light touch is intact bilateral, vibratory sensation intact bilateral, achilles tendon reflex is intact bilateral.  MUSCULOSKELETAL: Fusion of the proximal interphalangeal joint of her previous hammertoe surgeries noted. Mild contracture deformity at the distal interphalangeal joint is noted which is semi-flexible in nature. Bunion surgery site appears to be healing excellently. Mild discomfort at the metatarsophalangeal joint of the second toe is noted.  DERMATOLOGIC: skin color, texture, and turger are within normal limits.  No preulcerative lesions are seen, no interdigital maceration noted.  No open lesions present.  Digital nails are asymptomatic.   X-rays show well-healed bunion site as well as a well-healed fusion at the second proximal interphalangeal joint left.  Assessment: Neuropathy, neurapraxia left second toe, arthritis  Plan: Injected the left second toe with dexamethasone and Marcaine mixture without complication. Recommended a topical compounding cream and this will be sent in to [facility] on her behalf. She will call this does not help otherwise to be seen back as needed.

## 2013-04-21 NOTE — Patient Instructions (Signed)
i will order the cream for your toe-- call  If you have not received it or heard from them by tuesday

## 2013-04-25 ENCOUNTER — Telehealth: Payer: Self-pay | Admitting: *Deleted

## 2013-04-25 NOTE — Telephone Encounter (Signed)
I called and gave her Dr. Faylene MillionEgerton's response.  She stated that [facility]'s price was $120 for a large tube or $60 for a small tube.  I informed her we can try some where else but it will probably run about the same price.  She said she will have to think about it.  I advised her to let us know if she wants us to try another company.

## 2013-04-25 NOTE — Telephone Encounter (Signed)
The compounded cream has to come from a specialty compounding pharmacy-- we can try [facility] or there is a local compounding pharmacy that may be able to make a formulation that would help.   They have a cash pay price at [facility] so she should have been offered that price-- chances are her husband's insurance won't be accepted at any of them?  But we can try. Thanks.

## 2013-04-25 NOTE — Telephone Encounter (Signed)
I was there on Friday.  She prescribed me some cream.  Where ever it's coming from will not accept my husband's insurance.  [facility]  Let me know.

## 2013-05-02 NOTE — Progress Notes (Signed)
Dr Irving ShowsEgerton ordered Musculoskeletal pain-inflammation-TMJ cream containing Baclofen 2%, Gabapentin 6%, Cyclobenzaprine 2%, Ketoprofen 20%, Lidocaine 2.5% dispense 240gm with 3 refills from [facility].  Faxed.

## 2013-07-18 ENCOUNTER — Other Ambulatory Visit: Payer: Self-pay

## 2013-07-18 DIAGNOSIS — Z1231 Encounter for screening mammogram for malignant neoplasm of breast: Secondary | ICD-10-CM

## 2013-08-22 ENCOUNTER — Ambulatory Visit: Payer: BC Managed Care – PPO

## 2013-08-23 ENCOUNTER — Ambulatory Visit
Admission: RE | Admit: 2013-08-23 | Discharge: 2013-08-23 | Disposition: A | Discharge status: Home / Self Care | Payer: BC Managed Care – PPO | Source: Ambulatory Visit

## 2013-08-23 DIAGNOSIS — Z1231 Encounter for screening mammogram for malignant neoplasm of breast: Secondary | ICD-10-CM

## 2013-09-22 ENCOUNTER — Other Ambulatory Visit: Payer: Self-pay | Admitting: Orthopaedic Surgery

## 2013-09-27 ENCOUNTER — Ambulatory Visit: Payer: BC Managed Care – PPO | Admitting: Nurse Practitioner

## 2013-09-29 ENCOUNTER — Ambulatory Visit (INDEPENDENT_AMBULATORY_CARE_PROVIDER_SITE_OTHER): Payer: BC Managed Care – PPO | Admitting: Nurse Practitioner

## 2013-09-29 ENCOUNTER — Encounter: Payer: Self-pay | Admitting: Nurse Practitioner

## 2013-09-29 VITALS — BP 120/82 | HR 64 | Ht 66.5 in | Wt 168.0 lb

## 2013-09-29 DIAGNOSIS — R6889 Other general symptoms and signs: Secondary | ICD-10-CM

## 2013-09-29 DIAGNOSIS — Z01419 Encounter for gynecological examination (general) (routine) without abnormal findings: Secondary | ICD-10-CM

## 2013-09-29 DIAGNOSIS — Z1501 Genetic susceptibility to malignant neoplasm of breast: Secondary | ICD-10-CM | POA: Insufficient documentation

## 2013-09-29 DIAGNOSIS — Z1509 Genetic susceptibility to other malignant neoplasm: Secondary | ICD-10-CM

## 2013-09-29 DIAGNOSIS — Z8481 Family history of carrier of genetic disease: Secondary | ICD-10-CM

## 2013-09-29 DIAGNOSIS — Z0000 Encounter for general adult medical examination without abnormal findings: Secondary | ICD-10-CM

## 2013-09-29 DIAGNOSIS — M1610 Unilateral primary osteoarthritis, unspecified hip: Secondary | ICD-10-CM

## 2013-09-29 DIAGNOSIS — M1611 Unilateral primary osteoarthritis, right hip: Secondary | ICD-10-CM | POA: Insufficient documentation

## 2013-09-29 DIAGNOSIS — Z90722 Acquired absence of ovaries, bilateral: Secondary | ICD-10-CM | POA: Insufficient documentation

## 2013-09-29 DIAGNOSIS — E559 Vitamin D deficiency, unspecified: Secondary | ICD-10-CM | POA: Insufficient documentation

## 2013-09-29 DIAGNOSIS — Z9079 Acquired absence of other genital organ(s): Secondary | ICD-10-CM

## 2013-09-29 DIAGNOSIS — Z808 Family history of malignant neoplasm of other organs or systems: Secondary | ICD-10-CM | POA: Insufficient documentation

## 2013-09-29 DIAGNOSIS — R899 Unspecified abnormal finding in specimens from other organs, systems and tissues: Secondary | ICD-10-CM

## 2013-09-29 DIAGNOSIS — Z8041 Family history of malignant neoplasm of ovary: Secondary | ICD-10-CM

## 2013-09-29 DIAGNOSIS — Z803 Family history of malignant neoplasm of breast: Secondary | ICD-10-CM | POA: Insufficient documentation

## 2013-09-29 LAB — CBC WITH DIFFERENTIAL/PLATELET
BASOS ABS: 0 10*3/uL (ref 0.0–0.1)
BASOS PCT: 1 % (ref 0–1)
EOS ABS: 0.2 10*3/uL (ref 0.0–0.7)
Eosinophils Relative: 4 % (ref 0–5)
HEMATOCRIT: 36.4 % (ref 36.0–46.0)
Hemoglobin: 12.7 g/dL (ref 12.0–15.0)
Lymphocytes Relative: 34 % (ref 12–46)
Lymphs Abs: 1.5 10*3/uL (ref 0.7–4.0)
MCH: 33.2 pg (ref 26.0–34.0)
MCHC: 34.9 g/dL (ref 30.0–36.0)
MCV: 95.3 fL (ref 78.0–100.0)
MONO ABS: 0.4 10*3/uL (ref 0.1–1.0)
Monocytes Relative: 8 % (ref 3–12)
Neutro Abs: 2.4 10*3/uL (ref 1.7–7.7)
Neutrophils Relative %: 53 % (ref 43–77)
Platelets: 282 10*3/uL (ref 150–400)
RBC: 3.82 MIL/uL — ABNORMAL LOW (ref 3.87–5.11)
RDW: 13.2 % (ref 11.5–15.5)
WBC: 4.5 10*3/uL (ref 4.0–10.5)

## 2013-09-29 LAB — POCT URINALYSIS DIPSTICK
BILIRUBIN UA: NEGATIVE
Glucose, UA: NEGATIVE
Ketones, UA: NEGATIVE
NITRITE UA: NEGATIVE
PH UA: 5
Protein, UA: NEGATIVE
RBC UA: NEGATIVE
UROBILINOGEN UA: NEGATIVE

## 2013-09-29 LAB — HEMOGLOBIN, FINGERSTICK: Hemoglobin, fingerstick: 13 g/dL (ref 12.0–16.0)

## 2013-09-29 MED ORDER — VITAMIN D (ERGOCALCIFEROL) 1.25 MG (50000 UNIT) PO CAPS: 50000.0000 [IU] | ORAL_CAPSULE | ORAL | Status: DC

## 2013-09-29 NOTE — Progress Notes (Signed)
Patient ID: Nicole Casey, female   DOB: 1955/12/08, 58 y.o.   MRN: [id number] 58 y.o. [id number] Married Caucasian Fe here for annual exam.  She feels well except for right hip pain. The back pain last year was not resolved after the lumbar fusion and was found to have severe arthritis of the hip.  will be having right hip replacement this fall.   She is also very worried about her brother.  He has also tested positive for BRCA gene I/II.  He will be having some type of omentum and maybe testicular surgery coming up.  Daughter tested for BRCA I positive at age 31 and is considering bilateral mastectomies.   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  She is very overwhelmed about family health issues  Patient's last menstrual period was 02/03/2000.          Sexually active: no The current method of family planning is status post hysterectomy and abstinence.  Exercising: yes Gym/ health club routine includes high impact aerobics and light weights.  Smoker: no   Health Maintenance:  Pap: 09/24/11, WNL, neg HR HPV  MMG: 08/23/13, BI-Rads 1: negative  Colonoscopy: 10/23/09 normal repeat in 10 years  BMD: 08/2008 normal  TDaP: 09/19/10  Labs:  HB:  13.0  Urine:  Trace leuk's     reports that she quit smoking about 37 years ago. Her smoking use included Cigarettes. She has a 2 pack-year smoking history. She has never used smokeless tobacco. She reports that she drinks about 2.4 ounces of alcohol per week. She reports that she does not use illicit drugs.  Past Medical History  Diagnosis Date  . BRCA gene positive   . Herniated lumbar disc without myelopathy     L 3-4-5  . PONV (postoperative nausea and vomiting)     developed rash with knee replacement unsure of cause   . Arthritis     Past Surgical History  Procedure Laterality Date  . Cervical spine surgery  1993    hernaited disc repair  . Total knee arthroplasty Right 2007  . Abdominal hysterectomy  2002   fibroids, DUB  . Tubal ligation Bilateral 1987  . Salpingoophorectomy Right 1988    secondary to cyst  . Salpingoophorectomy Left 09/2008    secondary to BRCA +  . Retinal detachment surgery Right 1993  . Joint replacement      right knee  . Foot surgery      let foot  . Colonoscopy    . Esophagogastroduodenoscopy    . Lumbar fusion  10/2012    L3.L4,L5,L6, fusion and repair    Current Outpatient Prescriptions  Medication Sig Dispense Refill  . HYDROCODONE-ACETAMINOPHEN PO Take by mouth.      . meloxicam (MOBIC) 15 MG tablet Take 15 mg by mouth daily.      . Vitamin D, Ergocalciferol, (DRISDOL) 50000 UNITS CAPS capsule Take 1 capsule (50,000 Units total) by mouth every 7 (seven) days.  30 capsule  2   No current facility-administered medications for this visit.    Family History  Problem Relation Age of Onset  . Ovarian cancer Paternal Grandmother   . Breast cancer Cousin     paternal  . Ovarian cancer Cousin     paternal  . Cervical cancer Cousin     paternal  . Breast cancer Cousin     paternal  . Leukemia Daughter     ROS:  Pertinent items are noted  in HPI.  Otherwise, a comprehensive ROS was negative.  Exam:   BP 120/82  Pulse 64  Ht 5' 6.5" (1.689 m)  Wt 168 lb (76.204 kg)  BMI 26.71 kg/m2  LMP 02/03/2000 Height: 5' 6.5" (168.9 cm)  Ht Readings from Last 3 Encounters:  09/29/13 5' 6.5" (1.689 m)  10/26/12 '5\' 6"'  (1.676 m)  10/26/12 '5\' 6"'  (1.676 m)    General appearance: alert, cooperative and appears stated age Head: Normocephalic, without obvious abnormality, atraumatic Neck: no adenopathy, supple, symmetrical, trachea midline and thyroid normal to inspection and palpation Lungs: clear to auscultation bilaterally Breasts: normal appearance, no masses or tenderness  Heart: regular rate and rhythm Abdomen: soft, non-tender; no masses,  no organomegaly Extremities: extremities normal, atraumatic, no cyanosis or edema Skin: Skin color, texture, turgor  normal. No rashes or lesions Lymph nodes: Cervical, supraclavicular, and axillary nodes normal. No abnormal inguinal nodes palpated Neurologic: Grossly normal   Pelvic: External genitalia:  no lesions              Urethra:  normal appearing urethra with no masses, tenderness or lesions              Bartholin's and Skene's: normal                 Vagina: normal appearing vagina with normal color and discharge, no lesions              Cervix: absent              Pap taken: No. Bimanual Exam:  Uterus:  uterus absent              Adnexa: no mass, fullness, tenderness               Rectovaginal: Confirms               Anus:  normal sphincter tone, no lesions  A:  Well Woman with normal exam  Postmenopausal no HRT  Strong Herrings of OV, Breast cancer, cervical cancer, omentum cancer  S/P TAH 2002 secondary to fibroids and DUB   S/P BSO 1988 secondary to cyst & 09/2008 secondary to BRCA I + gene  Patient with BRCA I + = unable to tolerate Tamoxifen 2/12 (saw Dr. Lamonte Sakai)  History of Vit d deficiency  OA of right hip with upcoming surgery for replacement in October  Situational stressors   P:   Reviewed health and wellness pertinent to exam  Pap smear not taken today  Mammogram is due 7/16 (insurance will cover breast MRI except for copay of $250 which she did not want to pay)  Will follow with labs  Counseled on breast self exam, mammography screening, adequate intake of calcium and vitamin D, diet and exercise, Kegel's exercises return annually or prn  An After Visit Summary was printed and given to the patient.

## 2013-09-30 LAB — LIPID PANEL
Cholesterol: 165 mg/dL (ref 0–200)
HDL: 71 mg/dL (ref >39)
LDL CALC: 78 mg/dL (ref 0–99)
TRIGLYCERIDES: 79 mg/dL (ref <150)
Total CHOL/HDL Ratio: 2.3 Ratio
VLDL: 16 mg/dL (ref 0–40)

## 2013-09-30 LAB — COMPREHENSIVE METABOLIC PANEL
ALT: 14 U/L (ref 0–35)
AST: 16 U/L (ref 0–37)
Albumin: 4.3 g/dL (ref 3.5–5.2)
Alkaline Phosphatase: 61 U/L (ref 39–117)
BUN: 13 mg/dL (ref 6–23)
CALCIUM: 9.6 mg/dL (ref 8.4–10.5)
CHLORIDE: 104 meq/L (ref 96–112)
CO2: 27 mEq/L (ref 19–32)
Creat: 0.76 mg/dL (ref 0.50–1.10)
Glucose, Bld: 93 mg/dL (ref 70–99)
Potassium: 4.6 mEq/L (ref 3.5–5.3)
Sodium: 141 mEq/L (ref 135–145)
Total Bilirubin: 1.4 mg/dL — ABNORMAL HIGH (ref 0.2–1.2)
Total Protein: 6.5 g/dL (ref 6.0–8.3)

## 2013-09-30 LAB — TSH: TSH: 1.546 u[IU]/mL (ref 0.350–4.500)

## 2013-09-30 LAB — VITAMIN D 25 HYDROXY (VIT D DEFICIENCY, FRACTURES): Vit D, 25-Hydroxy: 46 ng/mL (ref 30–89)

## 2013-10-02 NOTE — Progress Notes (Signed)
Encounter reviewed by Dr. Brook Silva.  

## 2013-10-02 NOTE — Patient Instructions (Signed)

## 2013-10-10 NOTE — Addendum Note (Signed)
Addended by: Francee Piccolo C on: 10/10/2013 01:40 PM   Modules accepted: Orders

## 2013-10-13 ENCOUNTER — Other Ambulatory Visit: Payer: Self-pay

## 2013-10-17 ENCOUNTER — Other Ambulatory Visit (INDEPENDENT_AMBULATORY_CARE_PROVIDER_SITE_OTHER): Payer: BC Managed Care – PPO

## 2013-10-17 DIAGNOSIS — R899 Unspecified abnormal finding in specimens from other organs, systems and tissues: Secondary | ICD-10-CM

## 2013-10-17 DIAGNOSIS — R6889 Other general symptoms and signs: Secondary | ICD-10-CM

## 2013-10-17 LAB — HEPATIC FUNCTION PANEL
ALBUMIN: 4.4 g/dL (ref 3.5–5.2)
ALT: 13 U/L (ref 0–35)
AST: 19 U/L (ref 0–37)
Alkaline Phosphatase: 62 U/L (ref 39–117)
Bilirubin, Direct: 0.2 mg/dL (ref 0.0–0.3)
Indirect Bilirubin: 1.3 mg/dL — ABNORMAL HIGH (ref 0.2–1.2)
Total Bilirubin: 1.5 mg/dL — ABNORMAL HIGH (ref 0.2–1.2)
Total Protein: 6.5 g/dL (ref 6.0–8.3)

## 2013-11-14 ENCOUNTER — Encounter (HOSPITAL_COMMUNITY): Payer: Self-pay | Admitting: Pharmacy Technician

## 2013-11-15 ENCOUNTER — Other Ambulatory Visit (HOSPITAL_COMMUNITY): Payer: Self-pay | Admitting: *Deleted

## 2013-11-15 NOTE — Pre-Procedure Instructions (Signed)
Nicole LoneKathleen L Casey  11/15/2013   Your procedure is scheduled on:  Tuesday, November 28, 2013 at 9:55 AM.   [facility] at 8:00 AM.   Call this number if you have problems the morning of surgery: [phone]   Remember:   Do not eat food or drink liquids after midnight Monday, 11/27/13.   Take these medicines the morning of surgery with A SIP OF WATER: None  Stop Meloxicam as of Tuesday, 11/21/13.   Do not wear jewelry, make-up or nail polish.  Do not wear lotions, powders, or perfumes. You may wear deodorant.  Do not shave 48 hours prior to surgery.   Do not bring valuables to the hospital.  [name] is not responsible                  for any belongings or valuables.               Contacts, dentures or bridgework may not be worn into surgery.  Leave suitcase in the car. After surgery it may be brought to your room.  For patients admitted to the hospital, discharge time is determined by your                treatment team.               Please read over the following fact sheets that you were given: Pain Booklet, Coughing and Deep Breathing, Blood Transfusion Information, MRSA Information and Surgical Site Infection Prevention

## 2013-11-16 ENCOUNTER — Encounter (HOSPITAL_COMMUNITY)
Admission: RE | Admit: 2013-11-16 | Discharge: 2013-11-16 | Disposition: A | Discharge status: Home / Self Care | Payer: BC Managed Care – PPO | Source: Ambulatory Visit | Attending: Orthopaedic Surgery | Admitting: Orthopaedic Surgery

## 2013-11-16 ENCOUNTER — Encounter (HOSPITAL_COMMUNITY): Payer: Self-pay

## 2013-11-16 DIAGNOSIS — Z0000 Encounter for general adult medical examination without abnormal findings: Secondary | ICD-10-CM | POA: Insufficient documentation

## 2013-11-16 DIAGNOSIS — Z8041 Family history of malignant neoplasm of ovary: Secondary | ICD-10-CM | POA: Insufficient documentation

## 2013-11-16 DIAGNOSIS — M1611 Unilateral primary osteoarthritis, right hip: Secondary | ICD-10-CM | POA: Diagnosis not present

## 2013-11-16 DIAGNOSIS — E559 Vitamin D deficiency, unspecified: Secondary | ICD-10-CM | POA: Diagnosis not present

## 2013-11-16 DIAGNOSIS — M5126 Other intervertebral disc displacement, lumbar region: Secondary | ICD-10-CM | POA: Diagnosis not present

## 2013-11-16 LAB — URINALYSIS, ROUTINE W REFLEX MICROSCOPIC
Bilirubin Urine: NEGATIVE
GLUCOSE, UA: NEGATIVE mg/dL
HGB URINE DIPSTICK: NEGATIVE
Ketones, ur: NEGATIVE mg/dL
Nitrite: NEGATIVE
PH: 8 (ref 5.0–8.0)
Protein, ur: NEGATIVE mg/dL
SPECIFIC GRAVITY, URINE: 1.009 (ref 1.005–1.030)
Urobilinogen, UA: 0.2 mg/dL (ref 0.0–1.0)

## 2013-11-16 LAB — CBC WITH DIFFERENTIAL/PLATELET
Basophils Absolute: 0.1 10*3/uL (ref 0.0–0.1)
Basophils Relative: 1 % (ref 0–1)
EOS ABS: 0.2 10*3/uL (ref 0.0–0.7)
EOS PCT: 4 % (ref 0–5)
HEMATOCRIT: 38.8 % (ref 36.0–46.0)
HEMOGLOBIN: 13.2 g/dL (ref 12.0–15.0)
LYMPHS PCT: 29 % (ref 12–46)
Lymphs Abs: 1.5 10*3/uL (ref 0.7–4.0)
MCH: 33.6 pg (ref 26.0–34.0)
MCHC: 34 g/dL (ref 30.0–36.0)
MCV: 98.7 fL (ref 78.0–100.0)
MONO ABS: 0.5 10*3/uL (ref 0.1–1.0)
MONOS PCT: 9 % (ref 3–12)
Neutro Abs: 2.8 10*3/uL (ref 1.7–7.7)
Neutrophils Relative %: 57 % (ref 43–77)
PLATELETS: 257 10*3/uL (ref 150–400)
RBC: 3.93 MIL/uL (ref 3.87–5.11)
RDW: 12 % (ref 11.5–15.5)
WBC: 5 10*3/uL (ref 4.0–10.5)

## 2013-11-16 LAB — BASIC METABOLIC PANEL
Anion gap: 13 (ref 5–15)
BUN: 16 mg/dL (ref 6–23)
CO2: 26 meq/L (ref 19–32)
Calcium: 9.3 mg/dL (ref 8.4–10.5)
Chloride: 100 mEq/L (ref 96–112)
Creatinine, Ser: 0.68 mg/dL (ref 0.50–1.10)
GFR calc Af Amer: >90 mL/min (ref >90)
GLUCOSE: 85 mg/dL (ref 70–99)
POTASSIUM: 4 meq/L (ref 3.7–5.3)
Sodium: 139 mEq/L (ref 137–147)

## 2013-11-16 LAB — URINE MICROSCOPIC-ADD ON

## 2013-11-16 LAB — TYPE AND SCREEN
ABO/RH(D): O POS
Antibody Screen: NEGATIVE

## 2013-11-16 LAB — SURGICAL PCR SCREEN
MRSA, PCR: NEGATIVE
Staphylococcus aureus: NEGATIVE

## 2013-11-16 NOTE — Progress Notes (Signed)
Anesthesia Chart Review:  Patient is a 58 year old female scheduled for right THA, anterior approach on 11/28/13 by Dr. Rhona Raider.  Case is posited for [facility] anesthesia.  History includes former smoker, post-operative N/V, arthritis, BRCA gene positive, right TKA, hysterectomy '02, lumbar fusion 10/2012, c-spine surgery. No PCP reported.    EKG on 11/16/13 showed: SB at 53 bpm, cannot rule out anterior infarct (age undetermined).  Her EKG was not felt significantly changed when compared to prior tracing from 2010 [facility]).  CXR on 11/16/13 showed: No edema or consolidation.  Preoperative labs noted. UA showed moderate leukocytes, negative nitrites, many bacteria. No urine culture was ordered.  PT/PTT will need to be redrawn on the day of surgery due to inadequate specimen.  Voice message left with Dr. Jerald Kief scheduler asking him to review UA results.  Will defer treatment recommendations, if any, to the surgeon.    From an anesthesia standpoint, I think she will be able to proceed as planned if no acute changes.  She will be further evaluated by her assigned anesthesiologist on the day of surgery to discuss the definitive anesthesia plan.    George Hugh [facility] Short Stay Center/Anesthesiology Phone [phone] 11/16/2013 5:28 PM

## 2013-11-16 NOTE — Progress Notes (Signed)
Lab called stating inadequate sample for pt/ptt - need to be redrawn DOS

## 2013-11-16 NOTE — Progress Notes (Signed)
Primary - does not have one. No prior cardiac testing

## 2013-11-17 ENCOUNTER — Other Ambulatory Visit (HOSPITAL_COMMUNITY): Payer: BC Managed Care – PPO

## 2013-11-22 NOTE — H&P (Signed)
TOTAL HIP ADMISSION H&P  Patient is admitted for right total hip arthroplasty.  Subjective:  Chief Complaint: right hip pain  HPI: Nicole Casey, 58 y.o. female, has a history of pain and functional disability in the right hip(s) due to arthritis and patient has failed non-surgical conservative treatments for greater than 12 weeks to include NSAID's and/or analgesics, flexibility and strengthening excercises, use of assistive devices, weight reduction as appropriate and activity modification.  Onset of symptoms was gradual starting 5 years ago with gradually worsening course since that time.The patient noted no past surgery on the right hip(s).  Patient currently rates pain in the right hip at 10 out of 10 with activity. Patient has night pain, worsening of pain with activity and weight bearing and pain that interfers with activities of daily living. Patient has evidence of subchondral cysts, subchondral sclerosis, periarticular osteophytes and joint space narrowing by imaging studies. This condition presents safety issues increasing the risk of falls. There is no current active infection.  Patient Active Problem List   Diagnosis Date Noted  . Primary osteoarthritis of right hip 09/29/2013  . S/P BSO (status post bilateral salpingo-oophorectomy) 09/29/2013  . Family history of secondary ovarian cancer 09/29/2013  . Family history of breast cancer in female 09/29/2013  . BRCA1 positive 09/29/2013  . Unspecified vitamin D deficiency 09/29/2013  . FH: BRCA gene positive 09/29/2013   Past Medical History  Diagnosis Date  . BRCA gene positive 06/28/2008    did not tolerate Tamoxifen - saw Dr. Lamonte Sakai  . Herniated lumbar disc without myelopathy     L 3-4-5  . PONV (postoperative nausea and vomiting)     developed rash with knee replacement unsure of cause   . Arthritis     Past Surgical History  Procedure Laterality Date  . Cervical spine surgery  1993    hernaited disc repair  . Total  knee arthroplasty Right 2007  . Abdominal hysterectomy  2002    fibroids, DUB  . Tubal ligation Bilateral 1987  . Salpingoophorectomy Right 1988    secondary to cyst  . Salpingoophorectomy Left 09/2008    secondary to BRCA +  . Retinal detachment surgery Right 1993  . Joint replacement      right knee  . Foot surgery      let foot  . Colonoscopy    . Esophagogastroduodenoscopy    . Lumbar fusion  10/2012    L3.L4,L5,L6, fusion and repair  . Back surgery    . Eye surgery      No prescriptions prior to admission   No Known Allergies  History  Substance Use Topics  . Smoking status: Former Smoker -- 0.50 packs/day for 4 years    Types: Cigarettes    Quit date: 02/03/1976  . Smokeless tobacco: Never Used     Comment: for a while in college  . Alcohol Use: 2.4 oz/week    4 Glasses of wine per week    Family History  Problem Relation Age of Onset  . Ovarian cancer Paternal Grandmother   . Breast cancer Cousin     paternal  . Ovarian cancer Cousin     paternal  . Cervical cancer Cousin     paternal  . Breast cancer Cousin     paternal  . Leukemia Daughter      Review of Systems  Musculoskeletal: Positive for joint pain.       Right hip    Objective:  Physical Exam  Constitutional:  She is oriented to person, place, and time. She appears well-developed and well-nourished.  HENT:  Head: Normocephalic and atraumatic.  Eyes: Conjunctivae are normal. Pupils are equal, round, and reactive to light.  Neck: Normal range of motion.  Cardiovascular: Normal rate and regular rhythm.   Respiratory: Effort normal.  GI: Soft.  Musculoskeletal:  Right hip motion is very tender especially in internal rotation. Her leg lengths are roughly equal. Straight leg raise does cause hip pain. She has a well-healed incision on her back. Abdominal exam is benign. Sensation and motor function are intact in her feet with palpable pulses on both sides. There is no palpable lymphadenopathy at  her groin. She has normal skin across the anterior hip.   Neurological: She is alert and oriented to person, place, and time.  Skin: Skin is warm and dry.  Psychiatric: She has a normal mood and affect. Her behavior is normal. Judgment and thought content normal.    Vital signs in last 24 hours:    Labs:   Estimated body mass index is 28.18 kg/(m^2) as calculated from the following:   Height as of 10/26/12: '5\' 6"'  (1.676 m).   Weight as of 10/14/12: 79.153 kg (174 lb 8 oz).   Imaging Review Plain radiographs demonstrate severe degenerative joint disease of the right hip(s). The bone quality appears to be good for age and reported activity level.  Assessment/Plan:  End stage arthritis, right hip(s)  The patient history, physical examination, clinical judgement of the provider and imaging studies are consistent with end stage degenerative joint disease of the right hip(s) and total hip arthroplasty is deemed medically necessary. The treatment options including medical management, injection therapy, arthroscopy and arthroplasty were discussed at length. The risks and benefits of total hip arthroplasty were presented and reviewed. The risks due to aseptic loosening, infection, stiffness, dislocation/subluxation,  thromboembolic complications and other imponderables were discussed.  The patient acknowledged the explanation, agreed to proceed with the plan and consent was signed. Patient is being admitted for inpatient treatment for surgery, pain control, PT, OT, prophylactic antibiotics, VTE prophylaxis, progressive ambulation and ADL's and discharge planning.The patient is planning to be discharged home with home health services

## 2013-11-27 MED ORDER — LACTATED RINGERS IV SOLN: INTRAVENOUS | Status: DC

## 2013-11-27 MED ORDER — CEFAZOLIN SODIUM-DEXTROSE 2-3 GM-% IV SOLR
2.0000 g | INTRAVENOUS | Status: AC
  Administered 2013-11-28: 2 g via INTRAVENOUS
  Filled 2013-11-27: qty 50

## 2013-11-27 NOTE — Progress Notes (Signed)
Left message for patient with new arrival time of 0700

## 2013-11-28 ENCOUNTER — Inpatient Hospital Stay (HOSPITAL_COMMUNITY)
Admission: RE | Admit: 2013-11-28 | Discharge: 2013-11-29 | DRG: 470 | Disposition: A | Discharge status: Home Health | Payer: BC Managed Care – PPO | Source: Ambulatory Visit | Attending: Orthopaedic Surgery | Admitting: Orthopaedic Surgery

## 2013-11-28 ENCOUNTER — Encounter (HOSPITAL_COMMUNITY): Payer: Self-pay | Admitting: *Deleted

## 2013-11-28 ENCOUNTER — Inpatient Hospital Stay (HOSPITAL_COMMUNITY): Payer: BC Managed Care – PPO

## 2013-11-28 ENCOUNTER — Encounter (HOSPITAL_COMMUNITY): Payer: BC Managed Care – PPO | Admitting: Vascular Surgery

## 2013-11-28 ENCOUNTER — Encounter (HOSPITAL_COMMUNITY)
Admission: RE | Disposition: A | Discharge status: Home Health | Payer: Self-pay | Source: Ambulatory Visit | Attending: Orthopaedic Surgery

## 2013-11-28 ENCOUNTER — Inpatient Hospital Stay (HOSPITAL_COMMUNITY): Payer: BC Managed Care – PPO | Admitting: Anesthesiology

## 2013-11-28 DIAGNOSIS — Z7982 Long term (current) use of aspirin: Secondary | ICD-10-CM | POA: Diagnosis not present

## 2013-11-28 DIAGNOSIS — M169 Osteoarthritis of hip, unspecified: Secondary | ICD-10-CM | POA: Diagnosis present

## 2013-11-28 DIAGNOSIS — Z96651 Presence of right artificial knee joint: Secondary | ICD-10-CM | POA: Diagnosis present

## 2013-11-28 DIAGNOSIS — M1611 Unilateral primary osteoarthritis, right hip: Secondary | ICD-10-CM | POA: Diagnosis present

## 2013-11-28 DIAGNOSIS — Z87891 Personal history of nicotine dependence: Secondary | ICD-10-CM

## 2013-11-28 DIAGNOSIS — M25551 Pain in right hip: Secondary | ICD-10-CM | POA: Diagnosis present

## 2013-11-28 HISTORY — PX: TOTAL HIP ARTHROPLASTY: SHX124

## 2013-11-28 LAB — PROTIME-INR
INR: 1.03 (ref 0.00–1.49)
Prothrombin Time: 13.6 seconds (ref 11.6–15.2)

## 2013-11-28 LAB — APTT: APTT: 28 s (ref 24–37)

## 2013-11-28 SURGERY — ARTHROPLASTY, HIP, TOTAL, ANTERIOR APPROACH
Anesthesia: General | Laterality: Right

## 2013-11-28 MED ORDER — DOCUSATE SODIUM 100 MG PO CAPS
100.0000 mg | ORAL_CAPSULE | Freq: Two times a day (BID) | ORAL | Status: DC
  Administered 2013-11-28 – 2013-11-29 (×2): 100 mg via ORAL
  Filled 2013-11-28 (×3): qty 1

## 2013-11-28 MED ORDER — GLYCOPYRROLATE 0.2 MG/ML IJ SOLN
INTRAMUSCULAR | Status: DC | PRN
  Administered 2013-11-28: .6 mg via INTRAVENOUS

## 2013-11-28 MED ORDER — FENTANYL CITRATE 0.05 MG/ML IJ SOLN
INTRAMUSCULAR | Status: AC
  Filled 2013-11-28: qty 5

## 2013-11-28 MED ORDER — MIDAZOLAM HCL 5 MG/5ML IJ SOLN
INTRAMUSCULAR | Status: DC | PRN
  Administered 2013-11-28: 2 mg via INTRAVENOUS

## 2013-11-28 MED ORDER — METOCLOPRAMIDE HCL 10 MG PO TABS: 5.0000 mg | ORAL_TABLET | Freq: Three times a day (TID) | ORAL | Status: DC | PRN

## 2013-11-28 MED ORDER — METHOCARBAMOL 500 MG PO TABS
ORAL_TABLET | ORAL | Status: AC
  Administered 2013-11-28: 500 mg
  Filled 2013-11-28: qty 1

## 2013-11-28 MED ORDER — HYDROCODONE-ACETAMINOPHEN 5-325 MG PO TABS
1.0000 | ORAL_TABLET | ORAL | Status: DC | PRN
  Administered 2013-11-28: 2 via ORAL
  Administered 2013-11-28: 1 via ORAL
  Administered 2013-11-29 (×2): 2 via ORAL
  Filled 2013-11-28: qty 1
  Filled 2013-11-28 (×2): qty 2

## 2013-11-28 MED ORDER — LACTATED RINGERS IV SOLN
INTRAVENOUS | Status: DC | PRN
  Administered 2013-11-28 (×2): via INTRAVENOUS

## 2013-11-28 MED ORDER — PROPOFOL 10 MG/ML IV BOLUS
INTRAVENOUS | Status: DC | PRN
  Administered 2013-11-28: 200 mg via INTRAVENOUS

## 2013-11-28 MED ORDER — HYDROMORPHONE HCL 1 MG/ML IJ SOLN
INTRAMUSCULAR | Status: AC
  Filled 2013-11-28: qty 1

## 2013-11-28 MED ORDER — TRANEXAMIC ACID 100 MG/ML IV SOLN
1000.0000 mg | INTRAVENOUS | Status: AC
  Administered 2013-11-28: 1000 mg via INTRAVENOUS
  Filled 2013-11-28: qty 10

## 2013-11-28 MED ORDER — LACTATED RINGERS IV SOLN
INTRAVENOUS | Status: DC
  Administered 2013-11-28: 09:00:00 via INTRAVENOUS

## 2013-11-28 MED ORDER — HYDROCODONE-ACETAMINOPHEN 5-325 MG PO TABS
ORAL_TABLET | ORAL | Status: AC
  Filled 2013-11-28: qty 2

## 2013-11-28 MED ORDER — ONDANSETRON HCL 4 MG/2ML IJ SOLN
INTRAMUSCULAR | Status: AC
  Filled 2013-11-28: qty 2

## 2013-11-28 MED ORDER — ARTIFICIAL TEARS OP OINT
TOPICAL_OINTMENT | OPHTHALMIC | Status: DC | PRN
  Administered 2013-11-28: 1 via OPHTHALMIC

## 2013-11-28 MED ORDER — 0.9 % SODIUM CHLORIDE (POUR BTL) OPTIME
TOPICAL | Status: DC | PRN
  Administered 2013-11-28: 1000 mL

## 2013-11-28 MED ORDER — ONDANSETRON HCL 4 MG/2ML IJ SOLN: 4.0000 mg | Freq: Four times a day (QID) | INTRAMUSCULAR | Status: DC | PRN

## 2013-11-28 MED ORDER — HYDROMORPHONE HCL 1 MG/ML IJ SOLN
0.5000 mg | INTRAMUSCULAR | Status: DC | PRN
  Administered 2013-11-29: 1 mg via INTRAVENOUS
  Filled 2013-11-28: qty 1

## 2013-11-28 MED ORDER — CEFAZOLIN SODIUM-DEXTROSE 2-3 GM-% IV SOLR
2.0000 g | INTRAVENOUS | Status: AC
  Administered 2013-11-28: 2 g via INTRAVENOUS
  Filled 2013-11-28: qty 50

## 2013-11-28 MED ORDER — HYDROMORPHONE HCL 1 MG/ML IJ SOLN
0.5000 mg | Freq: Once | INTRAMUSCULAR | Status: AC
  Administered 2013-11-28 (×2): 0.5 mg via INTRAVENOUS

## 2013-11-28 MED ORDER — ACETAMINOPHEN 650 MG RE SUPP: 650.0000 mg | Freq: Four times a day (QID) | RECTAL | Status: DC | PRN

## 2013-11-28 MED ORDER — CEFAZOLIN SODIUM-DEXTROSE 2-3 GM-% IV SOLR
2.0000 g | Freq: Four times a day (QID) | INTRAVENOUS | Status: AC
  Administered 2013-11-28: 2 g via INTRAVENOUS
  Filled 2013-11-28: qty 50

## 2013-11-28 MED ORDER — HYDROMORPHONE HCL 1 MG/ML IJ SOLN
INTRAMUSCULAR | Status: DC | PRN
  Administered 2013-11-28: 1 mg via INTRAVENOUS

## 2013-11-28 MED ORDER — ACETAMINOPHEN 325 MG PO TABS
650.0000 mg | ORAL_TABLET | Freq: Four times a day (QID) | ORAL | Status: DC | PRN
  Administered 2013-11-29: 650 mg via ORAL
  Filled 2013-11-28: qty 2

## 2013-11-28 MED ORDER — BISACODYL 5 MG PO TBEC: 5.0000 mg | DELAYED_RELEASE_TABLET | Freq: Every day | ORAL | Status: DC | PRN

## 2013-11-28 MED ORDER — ONDANSETRON HCL 4 MG/2ML IJ SOLN
INTRAMUSCULAR | Status: DC | PRN
  Administered 2013-11-28: 4 mg via INTRAVENOUS

## 2013-11-28 MED ORDER — DEXAMETHASONE SODIUM PHOSPHATE 10 MG/ML IJ SOLN
INTRAMUSCULAR | Status: DC | PRN
  Administered 2013-11-28: 10 mg via INTRAVENOUS

## 2013-11-28 MED ORDER — INFLUENZA VAC SPLIT QUAD 0.5 ML IM SUSY
0.5000 mL | PREFILLED_SYRINGE | INTRAMUSCULAR | Status: DC
  Filled 2013-11-28: qty 0.5

## 2013-11-28 MED ORDER — ROCURONIUM BROMIDE 100 MG/10ML IV SOLN
INTRAVENOUS | Status: DC | PRN
  Administered 2013-11-28: 10 mg via INTRAVENOUS
  Administered 2013-11-28: 30 mg via INTRAVENOUS
  Administered 2013-11-28: 10 mg via INTRAVENOUS

## 2013-11-28 MED ORDER — PHENOL 1.4 % MT LIQD: 1.0000 | OROMUCOSAL | Status: DC | PRN

## 2013-11-28 MED ORDER — METHOCARBAMOL 1000 MG/10ML IJ SOLN
500.0000 mg | Freq: Four times a day (QID) | INTRAMUSCULAR | Status: DC | PRN
  Administered 2013-11-28: 500 mg via INTRAVENOUS
  Filled 2013-11-28: qty 5

## 2013-11-28 MED ORDER — ONDANSETRON HCL 4 MG PO TABS: 4.0000 mg | ORAL_TABLET | Freq: Four times a day (QID) | ORAL | Status: DC | PRN

## 2013-11-28 MED ORDER — METHOCARBAMOL 500 MG PO TABS
500.0000 mg | ORAL_TABLET | Freq: Four times a day (QID) | ORAL | Status: DC | PRN
  Administered 2013-11-28 – 2013-11-29 (×2): 500 mg via ORAL
  Filled 2013-11-28 (×3): qty 1

## 2013-11-28 MED ORDER — LIDOCAINE HCL (CARDIAC) 20 MG/ML IV SOLN
INTRAVENOUS | Status: DC | PRN
  Administered 2013-11-28: 100 mg via INTRAVENOUS

## 2013-11-28 MED ORDER — ONDANSETRON HCL 4 MG/2ML IJ SOLN
4.0000 mg | Freq: Once | INTRAMUSCULAR | Status: AC | PRN
  Administered 2013-11-28: 4 mg via INTRAVENOUS

## 2013-11-28 MED ORDER — DIPHENHYDRAMINE HCL 12.5 MG/5ML PO ELIX
12.5000 mg | ORAL_SOLUTION | ORAL | Status: DC | PRN
Start: 2013-11-28 — End: 2013-11-29

## 2013-11-28 MED ORDER — PROPOFOL 10 MG/ML IV BOLUS
INTRAVENOUS | Status: AC
  Filled 2013-11-28: qty 20

## 2013-11-28 MED ORDER — ALUM & MAG HYDROXIDE-SIMETH 200-200-20 MG/5ML PO SUSP: 30.0000 mL | ORAL | Status: DC | PRN

## 2013-11-28 MED ORDER — EPHEDRINE SULFATE 50 MG/ML IJ SOLN
INTRAMUSCULAR | Status: DC | PRN
  Administered 2013-11-28: 10 mg via INTRAVENOUS

## 2013-11-28 MED ORDER — FENTANYL CITRATE 0.05 MG/ML IJ SOLN
INTRAMUSCULAR | Status: DC | PRN
  Administered 2013-11-28 (×2): 50 ug via INTRAVENOUS
  Administered 2013-11-28: 150 ug via INTRAVENOUS
  Administered 2013-11-28 (×2): 50 ug via INTRAVENOUS

## 2013-11-28 MED ORDER — HYDROMORPHONE HCL 1 MG/ML IJ SOLN
0.2500 mg | INTRAMUSCULAR | Status: DC | PRN
  Administered 2013-11-28 (×4): 0.5 mg via INTRAVENOUS

## 2013-11-28 MED ORDER — MIDAZOLAM HCL 2 MG/2ML IJ SOLN
INTRAMUSCULAR | Status: AC
  Filled 2013-11-28: qty 2

## 2013-11-28 MED ORDER — MENTHOL 3 MG MT LOZG: 1.0000 | LOZENGE | OROMUCOSAL | Status: DC | PRN

## 2013-11-28 MED ORDER — ASPIRIN EC 325 MG PO TBEC
325.0000 mg | DELAYED_RELEASE_TABLET | Freq: Two times a day (BID) | ORAL | Status: DC
  Administered 2013-11-29: 325 mg via ORAL
  Filled 2013-11-28 (×3): qty 1

## 2013-11-28 MED ORDER — METOCLOPRAMIDE HCL 5 MG/ML IJ SOLN: 5.0000 mg | Freq: Three times a day (TID) | INTRAMUSCULAR | Status: DC | PRN

## 2013-11-28 MED ORDER — LACTATED RINGERS IV SOLN
INTRAVENOUS | Status: DC
  Administered 2013-11-28 – 2013-11-29 (×2): 75 mL/h via INTRAVENOUS

## 2013-11-28 MED ORDER — CHLORHEXIDINE GLUCONATE 4 % EX LIQD
60.0000 mL | Freq: Once | CUTANEOUS | Status: DC
  Filled 2013-11-28: qty 60

## 2013-11-28 MED ORDER — NEOSTIGMINE METHYLSULFATE 10 MG/10ML IV SOLN
INTRAVENOUS | Status: DC | PRN
  Administered 2013-11-28: 4 mg via INTRAVENOUS

## 2013-11-28 SURGICAL SUPPLY — 54 items
BENZOIN TINCTURE PRP APPL 2/3 (GAUZE/BANDAGES/DRESSINGS) ×2 IMPLANT
BLADE SAW SGTL 18X1.27X75 (BLADE) ×2 IMPLANT
BLADE SURG ROTATE 9660 (MISCELLANEOUS) IMPLANT
CAPT HIP PF COP ×2 IMPLANT
CELLS DAT CNTRL 66122 CELL SVR (MISCELLANEOUS) ×1 IMPLANT
COVER PERINEAL POST (MISCELLANEOUS) ×2 IMPLANT
COVER SURGICAL LIGHT HANDLE (MISCELLANEOUS) ×2 IMPLANT
DRAPE C-ARM 42X72 X-RAY (DRAPES) ×2 IMPLANT
DRAPE STERI IOBAN 125X83 (DRAPES) ×2 IMPLANT
DRAPE U-SHAPE 47X51 STRL (DRAPES) ×6 IMPLANT
DRESSING AQUACEL AQ EXTRA 4X5 (GAUZE/BANDAGES/DRESSINGS) ×2 IMPLANT
DRSG AQUACEL AG ADV 3.5X10 (GAUZE/BANDAGES/DRESSINGS) ×2 IMPLANT
DURAPREP 26ML APPLICATOR (WOUND CARE) ×2 IMPLANT
ELECT BLADE 4.0 EZ CLEAN MEGAD (MISCELLANEOUS)
ELECT CAUTERY BLADE 6.4 (BLADE) ×2 IMPLANT
ELECT REM PT RETURN 9FT ADLT (ELECTROSURGICAL) ×2
ELECTRODE BLDE 4.0 EZ CLN MEGD (MISCELLANEOUS) IMPLANT
ELECTRODE REM PT RTRN 9FT ADLT (ELECTROSURGICAL) ×1 IMPLANT
FACESHIELD WRAPAROUND (MASK) ×4 IMPLANT
GLOVE BIO SURGEON STRL SZ 6.5 (GLOVE) ×2 IMPLANT
GLOVE BIO SURGEON STRL SZ8 (GLOVE) ×12 IMPLANT
GLOVE BIOGEL PI IND STRL 6 (GLOVE) ×1 IMPLANT
GLOVE BIOGEL PI IND STRL 6.5 (GLOVE) ×1 IMPLANT
GLOVE BIOGEL PI IND STRL 8 (GLOVE) ×3 IMPLANT
GLOVE BIOGEL PI IND STRL 9 (GLOVE) ×1 IMPLANT
GLOVE BIOGEL PI INDICATOR 6 (GLOVE) ×1
GLOVE BIOGEL PI INDICATOR 6.5 (GLOVE) ×1
GLOVE BIOGEL PI INDICATOR 8 (GLOVE) ×3
GLOVE BIOGEL PI INDICATOR 9 (GLOVE) ×1
GOWN STRL REUS W/ TWL LRG LVL3 (GOWN DISPOSABLE) ×1 IMPLANT
GOWN STRL REUS W/ TWL XL LVL3 (GOWN DISPOSABLE) ×2 IMPLANT
GOWN STRL REUS W/TWL LRG LVL3 (GOWN DISPOSABLE) ×1
GOWN STRL REUS W/TWL XL LVL3 (GOWN DISPOSABLE) ×2
KIT BASIN OR (CUSTOM PROCEDURE TRAY) ×2 IMPLANT
KIT ROOM TURNOVER OR (KITS) ×2 IMPLANT
LINER BOOT UNIVERSAL DISP (MISCELLANEOUS) IMPLANT
MANIFOLD NEPTUNE II (INSTRUMENTS) ×2 IMPLANT
NS IRRIG 1000ML POUR BTL (IV SOLUTION) ×2 IMPLANT
PACK TOTAL JOINT (CUSTOM PROCEDURE TRAY) ×2 IMPLANT
PAD ARMBOARD 7.5X6 YLW CONV (MISCELLANEOUS) ×4 IMPLANT
RTRCTR WOUND ALEXIS 18CM MED (MISCELLANEOUS) ×2
STAPLER VISISTAT 35W (STAPLE) ×2 IMPLANT
SUT ETHIBOND NAB CT1 #1 30IN (SUTURE) ×6 IMPLANT
SUT MNCRL AB 3-0 PS2 27 (SUTURE) ×2 IMPLANT
SUT VIC AB 0 CT1 27 (SUTURE)
SUT VIC AB 0 CT1 27XBRD ANBCTR (SUTURE) IMPLANT
SUT VIC AB 1 CT1 27 (SUTURE) ×1
SUT VIC AB 1 CT1 27XBRD ANBCTR (SUTURE) ×1 IMPLANT
SUT VIC AB 2-0 CT1 27 (SUTURE) ×1
SUT VIC AB 2-0 CT1 TAPERPNT 27 (SUTURE) ×1 IMPLANT
SUT VLOC 180 0 24IN GS25 (SUTURE) ×2 IMPLANT
TOWEL OR 17X24 6PK STRL BLUE (TOWEL DISPOSABLE) ×2 IMPLANT
TOWEL OR 17X26 10 PK STRL BLUE (TOWEL DISPOSABLE) ×4 IMPLANT
WATER STERILE IRR 1000ML POUR (IV SOLUTION) ×4 IMPLANT

## 2013-11-28 NOTE — Anesthesia Procedure Notes (Signed)
Procedure Name: Intubation Date/Time: 11/28/2013 9:34 AM Performed by: Carmela RimaMARTINELLI, Nicole Casey Pre-anesthesia Checklist: Patient identified, Timeout performed, Emergency Drugs available, Suction available and Patient being monitored Patient Re-evaluated:Patient Re-evaluated prior to inductionOxygen Delivery Method: Circle system utilized Preoxygenation: Pre-oxygenation with 100% oxygen Intubation Type: IV induction Ventilation: Mask ventilation without difficulty Laryngoscope Size: Mac and 3 Grade View: Grade I Tube type: Oral Tube size: 7.5 mm Placement Confirmation: positive ETCO2,  ETT inserted through vocal cords under direct vision and breath sounds checked- equal and bilateral Secured at: 22 cm Tube secured with: Tape Dental Injury: Teeth and Oropharynx as per pre-operative assessment

## 2013-11-28 NOTE — Transfer of Care (Signed)
Immediate Anesthesia Transfer of Care Note  Patient: Nicole Casey  Procedure(s) Performed: Procedure(s): TOTAL HIP ARTHROPLASTY ANTERIOR APPROACH (Right)  Patient Location: PACU  Anesthesia Type:General  Level of Consciousness: awake, alert  and oriented  Airway & Oxygen Therapy: Patient Spontanous Breathing and Patient connected to nasal cannula oxygen  Post-op Assessment: Report given to PACU RN, Post -op Vital signs reviewed and stable and Patient moving all extremities X 4  Post vital signs: Reviewed and stable  Complications: No apparent anesthesia complications

## 2013-11-28 NOTE — Progress Notes (Signed)
Report given to philip rn as caregiver 

## 2013-11-28 NOTE — Anesthesia Postprocedure Evaluation (Signed)
  Anesthesia Post-op Note  Patient: Nicole Casey  Procedure(s) Performed: Procedure(s): TOTAL HIP ARTHROPLASTY ANTERIOR APPROACH (Right)  Patient Location: PACU  Anesthesia Type:General  Level of Consciousness: awake and alert   Airway and Oxygen Therapy: Patient Spontanous Breathing  Post-op Pain: mild  Post-op Assessment: Post-op Vital signs reviewed, Patient's Cardiovascular Status Stable and Respiratory Function Stable  Post-op Vital Signs: Reviewed  Filed Vitals:   11/28/13 1230  BP: 99/48  Pulse: 61  Temp:   Resp: 13    Complications: No apparent anesthesia complications

## 2013-11-28 NOTE — Op Note (Signed)
PRE-OP DIAGNOSIS:  RIGHT HIP DEGENERATIVE JOINT DISEASE POST-OP DIAGNOSIS:  same PROCEDURE: RIGHT TOTAL HIP ARTHROPLASTY ANTERIOR APPROACH ANESTHESIA:  General SURGEON:  Marcene CorningPeter Kashayla Ungerer MD ASSISTANT:  Elodia FlorenceAndrew Nida PA-C   INDICATIONS FOR PROCEDURE:  The patient is a 58 y.o. female with a long history of a painful hip.  This has persisted despite multiple conservative measures.  The patient has persisted with pain and dysfunction making rest and activity difficult.  A total hip replacement is offered as surgical treatment.  Informed operative consent was obtained after discussion of possible complications including reaction to anesthesia, infection, neurovascular injury, dislocation, DVT, PE, and death.  The importance of the postoperative rehab program to optimize result was stressed with the patient.  SUMMARY OF FINDINGS AND PROCEDURE:  Under general anesthesia through a anterior approach an the Hana table a right THR was performed.  The patient had severe degenerative change and good bone quality.  We used DePuy components to replace the hip and these were size KA 12 Corail femur capped with a +1.5 36mm ceramic hip ball.  On the acetabular side we used a size 52 Gription shell with a  plus 4 neutral polyethylene liner.  We did use a hole eliminator.  Elodia FlorenceAndrew Nida PA-C assisted throughout and was invaluable to the completion of the case in that he helped position and retract while I performed the procedure.  He also closed simultaneously to help minimize OR time.  I used fluoroscopy throughout the case to check position of implants and leg lengths and read all of these views myself.  DESCRIPTION OF PROCEDURE:  The patient was taken to the OR suite where general anesthetic was applied.  The patient was then positioned on the Hana table supine.  All bony prominences were appropriately padded.  Prep and drape was then performed in normal sterile fashion.  The patient was given kefzol preoperative antibiotic  and an appropriate time out was performed.  We then took an anterior approach to the right hip.  Dissection was taken through adipose to the tensor fascia lata fascia.  This structure was incised longitudinally and we dissected in the intermuscular interval just medial to this muscle.  Cobra retractors were placed superior and inferior to the femoral neck superficial to the capsule.  A capsular incision was then made and the retractors were placed along the femoral neck.  Xray was brought in to get a good level for the femoral neck cut which was made with an oscillating saw and osteotome.  The femoral head was removed with a corkscrew.  The acetabulum was exposed and some labral tissues were excised. Reaming was taken to the inside wall of the pelvis and sequentially up to 1 mm smaller than the actual component.  A trial of components was done and then the aforementioned acetabular shell was placed in appropriate tilt and anteversion confirmed by fluoroscopy. The liner was placed along with the hole eliminator and attention was turned to the femur.  The leg was brought down and over into adduction and the elevator bar was used to raise the femur up gently in the wound.  The piriformis was released with care taken to preserve the obturator internus attachment and all of the posterior capsule. The femur was reamed and then broached to the appropriate size.  A trial reduction was done and the aforementioned head and neck assembly gave us the best stability in extension with external rotation.  Leg lengths were felt to be about equal by fluoroscopic exam.  The trial components were removed and the wound irrigated.  We then placed the femoral component in appropriate anteversion.  The head was applied to a dry stem neck and the hip again reduced.  It was again stable in the aforementioned position.  The would was irrigated again followed by re-approximation of anterior capsule with ethibond suture. Tensor fascia was  repaired with V-loc suture  followed by subcutaneous closure with #O and #2 undyed vicryl.  Skin was closed with a subQ stitch followed by steri strips and a sterile dressing.  EBL and IOF can be obtained from anesthesia records.  DISPOSITION:  The patient was extubated in the OR and taken to PACU in stable condition to be admitted to the Orthopedic Surgery for appropriate post-op care to include perioperative antibiotics and DVT prophylaxis.

## 2013-11-28 NOTE — Interval H&P Note (Signed)
History and Physical Interval Note:  11/28/2013 9:24 AM  Nicole Casey  has presented today for surgery, with the diagnosis of RIGHT HIP OSTEOARTHRITIS  The various methods of treatment have been discussed with the patient and family. After consideration of risks, benefits and other options for treatment, the patient has consented to  Procedure(s): TOTAL HIP ARTHROPLASTY ANTERIOR APPROACH (Right) as a surgical intervention .  The patient's history has been reviewed, patient examined, no change in status, stable for surgery.  I have reviewed the patient's chart and labs.  Questions were answered to the patient's satisfaction.     Nasim Garofano G

## 2013-11-28 NOTE — Anesthesia Preprocedure Evaluation (Addendum)
Anesthesia Evaluation  Patient identified by MRN, date of birth, ID band Patient awake    Reviewed: Allergy & Precautions, H&P , NPO status , Patient's Chart, lab work & pertinent test results  History of Anesthesia Complications (+) PONV and history of anesthetic complications  Airway Mallampati: II  TM Distance: >3 FB Neck ROM: full    Dental  (+) Teeth Intact   Pulmonary former smoker,          Cardiovascular Rhythm:regular     Neuro/Psych    GI/Hepatic   Endo/Other    Renal/GU      Musculoskeletal  (+) Arthritis -,   Abdominal   Peds  Hematology   Anesthesia Other Findings   Reproductive/Obstetrics                           Anesthesia Physical Anesthesia Plan  ASA: I  Anesthesia Plan: General   Post-op Pain Management:    Induction: Intravenous  Airway Management Planned: Oral ETT  Additional Equipment:   Intra-op Plan:   Post-operative Plan: Extubation in OR  Informed Consent: I have reviewed the patients History and Physical, chart, labs and discussed the procedure including the risks, benefits and alternatives for the proposed anesthesia with the patient or authorized representative who has indicated his/her understanding and acceptance.   Dental Advisory Given  Plan Discussed with: CRNA, Anesthesiologist and Surgeon  Anesthesia Plan Comments:        Anesthesia Quick Evaluation

## 2013-11-29 ENCOUNTER — Encounter (HOSPITAL_COMMUNITY): Payer: Self-pay | Admitting: *Deleted

## 2013-11-29 LAB — CBC
HCT: 26.5 % — ABNORMAL LOW (ref 36.0–46.0)
Hemoglobin: 9.1 g/dL — ABNORMAL LOW (ref 12.0–15.0)
MCH: 34.5 pg — AB (ref 26.0–34.0)
MCHC: 34.3 g/dL (ref 30.0–36.0)
MCV: 100.4 fL — AB (ref 78.0–100.0)
Platelets: 198 10*3/uL (ref 150–400)
RBC: 2.64 MIL/uL — ABNORMAL LOW (ref 3.87–5.11)
RDW: 12.3 % (ref 11.5–15.5)
WBC: 6.6 10*3/uL (ref 4.0–10.5)

## 2013-11-29 LAB — BASIC METABOLIC PANEL
ANION GAP: 11 (ref 5–15)
BUN: 11 mg/dL (ref 6–23)
CALCIUM: 8.8 mg/dL (ref 8.4–10.5)
CO2: 26 mEq/L (ref 19–32)
CREATININE: 0.58 mg/dL (ref 0.50–1.10)
Chloride: 101 mEq/L (ref 96–112)
GFR calc Af Amer: >90 mL/min (ref >90)
Glucose, Bld: 114 mg/dL — ABNORMAL HIGH (ref 70–99)
Potassium: 4.3 mEq/L (ref 3.7–5.3)
SODIUM: 138 meq/L (ref 137–147)

## 2013-11-29 MED ORDER — METHOCARBAMOL 500 MG PO TABS: 500.0000 mg | ORAL_TABLET | Freq: Four times a day (QID) | ORAL | Status: DC | PRN

## 2013-11-29 MED ORDER — ASPIRIN 325 MG PO TBEC: 325.0000 mg | DELAYED_RELEASE_TABLET | Freq: Two times a day (BID) | ORAL | Status: DC

## 2013-11-29 MED ORDER — HYDROCODONE-ACETAMINOPHEN 5-325 MG PO TABS: 1.0000 | ORAL_TABLET | ORAL | Status: DC | PRN

## 2013-11-29 NOTE — Evaluation (Signed)
Occupational Therapy Evaluation Patient Details Name: Nicole Casey MRN: [id number] DOB: [birth year] Today's Date: 11/29/2013    History of Present Illness Pt is a 58 y.o. female s/p Rt THA on 11/28/13.   Clinical Impression   Pt admitted with the above diagnoses and presents with below problem list. PTA pt was independent with ADLs. Currently pt is min guard for ADLs. ADL education provided. Pt with good knowledge of ADL techniques from prior back surgery last year. Discussed safety with toilet and shower transfers. No further OT needs indicated at this time.     Follow Up Recommendations  Supervision/Assistance - 24 hour;No OT follow up    Equipment Recommendations  None recommended by OT    Recommendations for Other Services       Precautions / Restrictions Precautions Precautions: None Precaution Comments: direct anterior- no precautions  Restrictions Weight Bearing Restrictions: No      Mobility Bed Mobility Overal bed mobility: Needs Assistance Bed Mobility: Sit to Supine       Sit to supine: Min guard   General bed mobility comments: no physcial assist  Transfers Overall transfer level: Needs assistance Equipment used: Rolling walker (2 wheeled) Transfers: Sit to/from Stand Sit to Stand: Supervision         General transfer comment: cues to keep rw in front when pivoting to complete stand>sit    Balance Overall balance assessment: Needs assistance Sitting-balance support: Feet supported;No upper extremity supported Sitting balance-Leahy Scale: Good     Standing balance support: Bilateral upper extremity supported;During functional activity Standing balance-Leahy Scale: Fair Standing balance comment: stood at sink for ADLs                             ADL Overall ADL's : Needs assistance/impaired Eating/Feeding: Set up;Sitting   Grooming: Set up;Sitting;Standing   Upper Body Bathing: Set up;Sitting   Lower Body Bathing: Min  guard;Sit to/from stand;With adaptive equipment   Upper Body Dressing : Set up;Sitting   Lower Body Dressing: Min guard;Sit to/from stand;With adaptive equipment   Toilet Transfer: Min guard;Ambulation;RW (3n1 over toilet) Toilet Transfer Details (indicate cue type and reason): pt able to transfer on and off comfort height toilet with use of grab bar Toileting- Clothing Manipulation and Hygiene: Min guard;Sit to/from stand   Tub/ Shower Transfer: Min guard;Ambulation;3 in 1;Rolling walker   Functional mobility during ADLs: Supervision/safety;Rolling walker General ADL Comments: ADL education provided. Pt had back surgery last year and is familiar with LB ADL techniques. Discussed toilet and shower transfers and importance of having sturdy sink for toilet transfers and spouse assist for shower transfers. Discussed body mechanics for spouse assist. Pt does not want BSC.     Vision                     Perception     Praxis      Pertinent Vitals/Pain Pain Assessment: 0-10 Pain Score: 7  Pain Location: Rt hip Pain Descriptors / Indicators: Aching;Sore Pain Intervention(s): Limited activity within patient's tolerance;Monitored during session;Repositioned;Patient requesting pain meds-RN notified     Hand Dominance Right   Extremity/Trunk Assessment Upper Extremity Assessment Upper Extremity Assessment: Overall WFL for tasks assessed   Lower Extremity Assessment Lower Extremity Assessment: Defer to PT evaluation RLE Deficits / Details: hip 3/5   Cervical / Trunk Assessment Cervical / Trunk Assessment: Normal   Communication Communication Communication: No difficulties   Cognition Arousal/Alertness: Awake/alert Behavior During Therapy:  WFL for tasks assessed/performed Overall Cognitive Status: Within Functional Limits for tasks assessed                     General Comments       Exercises Exercises: Total Joint     Shoulder Instructions      Home  Living Family/patient expects to be discharged to:: Private residence Living Arrangements: Spouse/significant other Available Help at Discharge: Family;Available 24 hours/day Type of Home: House Home Access: Stairs to enter Entergy CorporationEntrance Stairs-Number of Steps: 2 Entrance Stairs-Rails: None Home Layout: Two level Alternate Level Stairs-Number of Steps: flight Alternate Level Stairs-Rails: Right Bathroom Shower/Tub: Producer, television/film/videoWalk-in shower   Bathroom Toilet: Standard Bathroom Accessibility: Yes How Accessible: Accessible via walker Home Equipment: Shower seat;Grab bars - tub/shower;Adaptive equipment Adaptive Equipment: Reacher;Sock aid;Long-handled shoe horn Additional Comments: pt has sturdy sink by toilet      Prior Functioning/Environment Level of Independence: Independent             OT Diagnosis: Acute pain   OT Problem List: Impaired balance (sitting and/or standing);Decreased knowledge of use of DME or AE;Decreased knowledge of precautions;Pain   OT Treatment/Interventions:      OT Goals(Current goals can be found in the care plan section) Acute Rehab OT Goals Patient Stated Goal: not stated  OT Frequency:     Barriers to D/C:            Co-evaluation              End of Session Equipment Utilized During Treatment: Rolling walker Nurse Communication: Patient requests pain meds  Activity Tolerance: Patient tolerated treatment well Patient left: in bed;with call bell/phone within reach   Time: 0905-0923 OT Time Calculation (min): 18 min Charges:  OT General Charges $OT Visit: 1 Procedure OT Evaluation $Initial OT Evaluation Tier I: 1 Procedure OT Treatments $Self Care/Home Management : 8-22 mins G-Codes:    Pilar GrammesMathews, Terriona Horlacher H 11/29/2013, 9:35 AM

## 2013-11-29 NOTE — Progress Notes (Signed)
Physical Therapy Treatment Patient Details Name: Nicole Casey MRN: [id number] DOB: [birth year] Today's Date: 11/29/2013    History of Present Illness Pt is a 58 y.o. female s/p Rt THA on 11/28/13.    PT Comments    Pt progressing well with therapy. Will need to review stair management technique tomorrow prior to D/C. Anticipate home tomorrow after therapy.   Follow Up Recommendations  Home health PT;Supervision/Assistance - 24 hour     Equipment Recommendations  Rolling walker with 5" wheels    Recommendations for Other Services OT consult     Precautions / Restrictions Precautions Precautions: None Precaution Comments: direct anterior- no precautions  Restrictions Weight Bearing Restrictions: No    Mobility  Bed Mobility Overal bed mobility: Modified Independent Bed Mobility: Supine to Sit     Supine to sit: Modified independent (Device/Increase time);HOB elevated Sit to supine: Min guard   General bed mobility comments: HOB elevated; incr time due to "soreness"  Transfers Overall transfer level: Needs assistance Equipment used: Rolling walker (2 wheeled) Transfers: Sit to/from Stand Sit to Stand: Supervision         General transfer comment: cues for hand placement and to have RW in place; dcr safety awareness with transfers  Ambulation/Gait Ambulation/Gait assistance: Supervision Ambulation Distance (Feet): 200 Feet Assistive device: Rolling walker (2 wheeled) Gait Pattern/deviations: Step-to pattern;Step-through pattern;Decreased stance time - right;Decreased step length - left;Narrow base of support Gait velocity: decreased Gait velocity interpretation: Below normal speed for age/gender General Gait Details: initially pt ambulating with step to gt and decr heel strike; with cues progressed to step through gt; decr Rt knee flexion due to "stiffness"   Stairs Stairs: Yes Stairs assistance: Min guard Stair Management: One rail  Right;Forwards;Sideways;Step to pattern Number of Stairs: 4 General stair comments: attempted with 2 different techniques; pt with min guard and cues for proper technique; more stable and safe with sideways technique   Wheelchair Mobility    Modified Rankin (Stroke Patients Only)       Balance Overall balance assessment: Needs assistance Sitting-balance support: Feet supported;No upper extremity supported Sitting balance-Leahy Scale: Good     Standing balance support: During functional activity;No upper extremity supported Standing balance-Leahy Scale: Fair Standing balance comment: stands without RW for static activities                     Cognition Arousal/Alertness: Awake/alert Behavior During Therapy: WFL for tasks assessed/performed Overall Cognitive Status: Within Functional Limits for tasks assessed                      Exercises Total Joint Exercises Ankle Circles/Pumps: AROM;Both;10 reps;Seated Quad Sets: AROM;Strengthening;Right;10 reps;Seated Gluteal Sets: 10 reps Long Arc Quad: AROM;Right;Seated Knee Flexion: AROM;Right;10 reps;Standing Marching in Standing: AROM;Strengthening;Right;10 reps;Standing    General Comments General comments (skin integrity, edema, etc.): reviewed THA HEP with pt       Pertinent Vitals/Pain Pain Assessment: 0-10 Pain Score: 6  Pain Location: Rt hip Pain Descriptors / Indicators: Spasm;Sore Pain Intervention(s): Monitored during session;Premedicated before session;Repositioned;Other (comment);Ice applied (pt requesting muscle relaxer)    Home Living Family/patient expects to be discharged to:: Private residence Living Arrangements: Spouse/significant other Available Help at Discharge: Family;Available 24 hours/day Type of Home: House Home Access: Stairs to enter Entrance Stairs-Rails: None Home Layout: Two level Home Equipment: Shower seat;Grab bars - tub/shower;Adaptive equipment Additional Comments: pt  has sturdy sink by toilet    Prior Function Level of Independence: Independent  PT Goals (current goals can now be found in the care plan section) Acute Rehab PT Goals Patient Stated Goal: to go home tomorrow PT Goal Formulation: With patient Time For Goal Achievement: 12/02/13 Potential to Achieve Goals: Good Progress towards PT goals: Progressing toward goals    Frequency  7X/week    PT Plan Current plan remains appropriate    Co-evaluation             End of Session Equipment Utilized During Treatment: Gait belt Activity Tolerance: Patient tolerated treatment well Patient left: in bed;with call bell/phone within reach     Time: 1124-1150 PT Time Calculation (min): 26 min  Charges:  $Gait Training: 8-22 mins $Therapeutic Exercise: 8-22 mins                    G CodesDonell Casey:      Nicole Casey, [place]PT  [phone] 11/29/2013, 12:05 PM

## 2013-11-29 NOTE — Progress Notes (Signed)
Subjective: 1 Day Post-Op Procedure(s) (LRB): TOTAL HIP ARTHROPLASTY ANTERIOR APPROACH (Right)  Activity level:  wbat Diet tolerance:  Eating well Voiding:  ok Patient reports pain as mild.    Objective: Vital signs in last 24 hours: Temp:  [97.8 F (36.6 C)-98.8 F (37.1 C)] 98.3 F (36.8 C) (10/28 0531) Pulse Rate:  [55-92] 69 (10/28 0531) Resp:  [10-19] 16 (10/28 0531) BP: (87-116)/(26-61) 110/55 mmHg (10/28 0531) SpO2:  [90 %-100 %] 92 % (10/28 0531)  Labs:  Recent Labs  11/29/13 0541  HGB 9.1*    Recent Labs  11/29/13 0541  WBC 6.6  RBC 2.64*  HCT 26.5*  PLT 198    Recent Labs  11/29/13 0541  NA 138  K 4.3  CL 101  CO2 26  BUN 11  CREATININE 0.58  GLUCOSE 114*  CALCIUM 8.8    Recent Labs  11/28/13 0821  INR 1.03    Physical Exam:  Neurologically intact ABD soft Neurovascular intact Sensation intact distally Intact pulses distally Dorsiflexion/Plantar flexion intact Incision: dressing C/D/I No cellulitis present Compartment soft  Assessment/Plan:  1 Day Post-Op Procedure(s) (LRB): TOTAL HIP ARTHROPLASTY ANTERIOR APPROACH (Right) Advance diet Up with therapy D/C IV fluids Plan for discharge tomorrow Discharge home with home health if doing well and cleared by PT. Continue on ASA 325mg  BID x 4 weeks. Follow up in office 2 weeks post op.     Nicole Casey, Ginger OrganNDREW PAUL 11/29/2013, 8:31 AM

## 2013-11-29 NOTE — Evaluation (Signed)
Physical Therapy Evaluation Patient Details Name: Nicole Casey MRN: [id number] DOB: [birth year] Today's Date: 11/29/2013   History of Present Illness  Pt is a 58 y.o. female s/p Rt THA on 11/28/13.  Clinical Impression  Pt is s/p Rt THA POD#1 resulting in the deficits listed below (see PT Problem List).  Pt will benefit from skilled PT to increase their independence and safety with mobility to allow discharge to the venue listed below. Anticipate good prognosis; pt very motivated and independent prior to admission.      Follow Up Recommendations Home health PT;Supervision/Assistance - 24 hour    Equipment Recommendations  Rolling walker with 5" wheels    Recommendations for Other Services OT consult     Precautions / Restrictions Precautions Precautions: None Precaution Comments: direct anterior- no precautions  Restrictions Weight Bearing Restrictions: No      Mobility  Bed Mobility               General bed mobility comments: pt sitting on EOB  Transfers Overall transfer level: Needs assistance Equipment used: Rolling walker (2 wheeled) Transfers: Sit to/from Stand Sit to Stand: Supervision         General transfer comment: cues for safety and hand placement with RW  Ambulation/Gait Ambulation/Gait assistance: Min guard Ambulation Distance (Feet): 150 Feet Assistive device: Rolling walker (2 wheeled) Gait Pattern/deviations: Step-through pattern;Decreased stride length;Shuffle;Narrow base of support Gait velocity: decreased Gait velocity interpretation: Below normal speed for age/gender General Gait Details: pt c/o "soreness" in Rt hip; cues for gt sequencing and safety with RW   Stairs            Wheelchair Mobility    Modified Rankin (Stroke Patients Only)       Balance Overall balance assessment: Needs assistance Sitting-balance support: Feet supported;No upper extremity supported Sitting balance-Leahy Scale: Good     Standing  balance support: During functional activity;No upper extremity supported Standing balance-Leahy Scale: Fair Standing balance comment: stood at sink for ADLs                              Pertinent Vitals/Pain Pain Assessment: 0-10 Pain Score: 2  Pain Location: Rt hip Pain Descriptors / Indicators: Sore Pain Intervention(s): Ice applied;Premedicated before session;Monitored during session    Home Living Family/patient expects to be discharged to:: Private residence Living Arrangements: Spouse/significant other Available Help at Discharge: Family;Available 24 hours/day Type of Home: House Home Access: Stairs to enter Entrance Stairs-Rails: None Entrance Stairs-Number of Steps: 2 Home Layout: Two level Home Equipment: None      Prior Function Level of Independence: Independent               Hand Dominance   Dominant Hand: Right    Extremity/Trunk Assessment   Upper Extremity Assessment: Defer to OT evaluation           Lower Extremity Assessment: RLE deficits/detail RLE Deficits / Details: hip 3/5    Cervical / Trunk Assessment: Normal  Communication   Communication: No difficulties  Cognition Arousal/Alertness: Awake/alert Behavior During Therapy: WFL for tasks assessed/performed Overall Cognitive Status: Within Functional Limits for tasks assessed                      General Comments      Exercises Total Joint Exercises Ankle Circles/Pumps: AROM;Both;10 reps;Seated Quad Sets: AROM;Strengthening;Right;10 reps;Seated Long Arc Quad: AROM;Strengthening;Right;10 reps;Seated      Assessment/Plan  PT Assessment Patient needs continued PT services  PT Diagnosis Abnormality of gait;Acute pain;Generalized weakness   PT Problem List Decreased strength;Decreased range of motion;Decreased activity tolerance;Decreased balance;Decreased mobility;Decreased knowledge of use of DME;Pain  PT Treatment Interventions DME instruction;Gait  training;Stair training;Functional mobility training;Therapeutic activities;Therapeutic exercise;Balance training;Neuromuscular re-education;Patient/family education   PT Goals (Current goals can be found in the Care Plan section) Acute Rehab PT Goals Patient Stated Goal: to go home tomorrow PT Goal Formulation: With patient Time For Goal Achievement: 12/02/13 Potential to Achieve Goals: Good    Frequency 7X/week   Barriers to discharge        Co-evaluation               End of Session Equipment Utilized During Treatment: Gait belt Activity Tolerance: Patient tolerated treatment well Patient left: in chair;with call bell/phone within reach Nurse Communication: Mobility status         Time: [id number] PT Time Calculation (min): 23 min   Charges:   PT Evaluation $Initial PT Evaluation Tier I: 1 Procedure PT Treatments $Gait Training: 8-22 mins   PT G CodesDonell Sievert:          Daron Breeding N, [place]PT  [phone] 11/29/2013, 8:29 AM

## 2013-11-29 NOTE — Discharge Summary (Signed)
Patient ID: Nicole Casey MRN: [id number] DOB/AGE: 58-Feb-1957 58 y.o.  Admit date: 11/28/2013 Discharge date: 11/29/2013  Admission Diagnoses:  Principal Problem:   Primary osteoarthritis of right hip Active Problems:   Degenerative joint disease (DJD) of hip   Discharge Diagnoses:  Same  Past Medical History  Diagnosis Date  . BRCA gene positive 06/28/2008    did not tolerate Tamoxifen - saw Dr. Lamonte Sakai  . Herniated lumbar disc without myelopathy     L 3-4-5  . PONV (postoperative nausea and vomiting)     developed rash with knee replacement unsure of cause   . Arthritis     Surgeries: Procedure(s): TOTAL HIP ARTHROPLASTY ANTERIOR APPROACH on 11/28/2013   Consultants:    Discharged Condition: Improved  Hospital Course: IDELLE REIMANN is an 58 y.o. female who was admitted 11/28/2013 for operative treatment ofPrimary osteoarthritis of right hip. Patient has severe unremitting pain that affects sleep, daily activities, and work/hobbies. After pre-op clearance the patient was taken to the operating room on 11/28/2013 and underwent  Procedure(s): TOTAL HIP ARTHROPLASTY ANTERIOR APPROACH.    Patient was given perioperative antibiotics: Anti-infectives   Start     Dose/Rate Route Frequency Ordered Stop   11/29/13 0030  ceFAZolin (ANCEF) IVPB 2 g/50 mL premix     2 g 100 mL/hr over 30 Minutes Intravenous Every 6 hours 11/28/13 1817 11/29/13 0005   11/28/13 1845  ceFAZolin (ANCEF) IVPB 2 g/50 mL premix     2 g 100 mL/hr over 30 Minutes Intravenous To Post Anesthesia Care Unit 11/28/13 1820 11/28/13 1907   11/28/13 0600  ceFAZolin (ANCEF) IVPB 2 g/50 mL premix     2 g 100 mL/hr over 30 Minutes Intravenous On call to O.R. 11/27/13 1409 11/28/13 0945       Patient was given sequential compression devices, early ambulation, and chemoprophylaxis to prevent DVT.  Patient benefited maximally from hospital stay and there were no complications.    Recent vital signs:  Patient Vitals for the past 24 hrs:  BP Temp Pulse Resp SpO2  11/29/13 1242 112/73 mmHg - 94 - 100 %  11/29/13 0531 110/55 mmHg 98.3 F (36.8 C) 69 16 92 %  11/29/13 0122 116/55 mmHg 98 F (36.7 C) 76 16 92 %  11/28/13 2200 105/59 mmHg 98 F (36.7 C) 70 16 95 %  11/28/13 1851 107/26 mmHg 98.8 F (37.1 C) 92 16 98 %  11/28/13 1830 - 97.8 F (36.6 C) - - -  11/28/13 1731 109/61 mmHg - - - -  11/28/13 1730 - 97.8 F (36.6 C) - - -  11/28/13 1719 - - 84 12 99 %  11/28/13 1715 - - 58 11 100 %  11/28/13 1700 - - 61 10 100 %  11/28/13 1647 100/52 mmHg - 64 11 99 %  11/28/13 1645 - - 63 10 99 %  11/28/13 1630 - - 61 11 99 %  11/28/13 1615 - - 63 12 100 %  11/28/13 1600 - - 60 11 99 %  11/28/13 1545 - - 71 14 100 %  11/28/13 1530 - - 66 12 99 %  11/28/13 1515 - - 70 12 99 %  11/28/13 1500 - - 64 10 99 %  11/28/13 1447 103/56 mmHg - 65 12 100 %  11/28/13 1445 - - 82 14 100 %  11/28/13 1430 - - 69 19 100 %  11/28/13 1415 102/56 mmHg - 65 10 99 %  11/28/13 1400 - - 62  10 98 %  11/28/13 1347 102/56 mmHg - 63 10 99 %  11/28/13 1345 - - 64 10 99 %  11/28/13 1330 - - 61 10 99 %  11/28/13 1315 98/52 mmHg 98 F (36.7 C) 61 10 99 %  11/28/13 1300 103/51 mmHg - 55 11 100 %     Recent laboratory studies:  Recent Labs  11/28/13 0821 11/29/13 0541  WBC  --  6.6  HGB  --  9.1*  HCT  --  26.5*  PLT  --  198  NA  --  138  K  --  4.3  CL  --  101  CO2  --  26  BUN  --  11  CREATININE  --  0.58  GLUCOSE  --  114*  INR 1.03  --   CALCIUM  --  8.8     Discharge Medications:     Medication List    STOP taking these medications       meloxicam 15 MG tablet  Commonly known as:  MOBIC      TAKE these medications       acetaminophen 500 MG tablet  Commonly known as:  TYLENOL  Take 1,000 mg by mouth every 6 (six) hours as needed.     aspirin 325 MG EC tablet  Take 1 tablet (325 mg total) by mouth 2 (two) times daily after a meal.     HYDROcodone-acetaminophen 5-325 MG per  tablet  Commonly known as:  NORCO/VICODIN  Take 1-2 tablets by mouth every 4 (four) hours as needed (breakthrough pain).     methocarbamol 500 MG tablet  Commonly known as:  ROBAXIN  Take 1 tablet (500 mg total) by mouth every 6 (six) hours as needed for muscle spasms.        Diagnostic Studies: Dg Chest 2 View  11/16/2013   CLINICAL DATA:  Preoperative total arthroplasty  EXAM: CHEST  2 VIEW  COMPARISON:  October 29, 2012  FINDINGS: There is no edema or consolidation. Heart size and pulmonary vascularity are normal. No adenopathy. No bone lesions.  IMPRESSION: No edema or consolidation.   Electronically Signed   By: Lowella Grip M.D.   On: 11/16/2013 09:06   Dg Hip Operative Right  11/28/2013   CLINICAL DATA:  RIGHT total hip replacement  EXAM: DG OPERATIVE RIGHT HIP 1-2 VIEWS  TECHNIQUE: Fluoroscopic spot image(s) were submitted for interpretation post-operatively.  COMPARISON:  Two digital C-arm fluoroscopic images obtained intraoperatively without priors for comparison.  FINDINGS: Components of RIGHT hip prosthesis in expected positions.  No acute fracture dislocation.  Bones appear demineralized.  IMPRESSION: RIGHT hip prosthesis without acute complication.   Electronically Signed   By: Lavonia [name] M.D.   On: 11/28/2013 11:45    Disposition: 01-Home or Self Care      Discharge Instructions   Call MD / Call 911    Complete by:  As directed   If you experience chest pain or shortness of breath, CALL 911 and be transported to the hospital emergency room.  If you develope a fever above 101 F, pus (white drainage) or increased drainage or redness at the wound, or calf pain, call your surgeon's office.     Constipation Prevention    Complete by:  As directed   Drink plenty of fluids.  Prune juice may be helpful.  You may use a stool softener, such as Colace (over the counter) 100 mg twice a day.  Use MiraLax (over  the counter) for constipation as needed.     Diet - low sodium  heart healthy    Complete by:  As directed      Increase activity slowly as tolerated    Complete by:  As directed            Follow-up Information   Follow up with Hessie Dibble, MD. Call in 2 weeks.   Specialty:  Orthopedic Surgery   Contact information:   [place] [address] [phone]        Signed: Rich Fuchs 11/29/2013, 12:59 PM

## 2013-11-29 NOTE — Progress Notes (Signed)
Utilization review completed.  

## 2013-11-29 NOTE — Progress Notes (Signed)
Patient provided with discharge instructions and follow up information. Prescriptions given to patients husband at bedside. DME rolling walker and bedside commode provided by [facility]. [facility] She is going home at this time with husband.

## 2013-11-29 NOTE — Care Management Note (Signed)
CARE MANAGEMENT NOTE 11/29/2013  Patient:  Nicole Casey,Nicole Casey   Account Number:  [id number]  Date Initiated:  11/29/2013  Documentation initiated by:  Vance PeperBRADY,Lyndsay Talamante  Subjective/Objective Assessment:   58 yr old female admitted with right hip osteoarthritis. patient had a right total hip arthroplasty.     Action/Plan:   [facility], no changes.  Patient has family support at discharge.   Anticipated DC Date:  11/29/2013   Anticipated DC Plan:  HOME W HOME HEALTH SERVICES      DC Planning Services  CM consult      [facility]  DURABLE MEDICAL EQUIPMENT   Choice offered to / List presented to:  C-1 Patient   DME arranged  [facility]  3-N-1      DME agency  [employer]     HH arranged  HH-2 PT      [facility]  [facility]   Status of service:  Completed, signed off Medicare Important Message given?   (If response is "NO", the following Medicare IM given date fields will be blank) Date Medicare IM given:   Medicare IM given by:   Date Additional Medicare IM given:   Additional Medicare IM given by:    Discharge Disposition:  HOME W HOME HEALTH SERVICES  Per UR Regulation:  Reviewed for med. necessity/level of care/duration of stay  If discussed at Long Length of Stay Meetings, dates discussed:    Comments:

## 2013-11-30 ENCOUNTER — Encounter (HOSPITAL_COMMUNITY): Payer: Self-pay | Admitting: Orthopaedic Surgery

## 2013-11-30 NOTE — Progress Notes (Signed)
Utilization review completed.  

## 2013-12-03 DIAGNOSIS — I82409 Acute embolism and thrombosis of unspecified deep veins of unspecified lower extremity: Secondary | ICD-10-CM

## 2013-12-03 HISTORY — DX: Acute embolism and thrombosis of unspecified deep veins of unspecified lower extremity: I82.409

## 2013-12-04 ENCOUNTER — Encounter (HOSPITAL_COMMUNITY): Payer: Self-pay | Admitting: Orthopaedic Surgery

## 2013-12-08 ENCOUNTER — Emergency Department (HOSPITAL_COMMUNITY)
Admission: EM | Admit: 2013-12-08 | Discharge: 2013-12-08 | Disposition: A | Discharge status: Home / Self Care | Payer: BC Managed Care – PPO | Attending: Emergency Medicine | Admitting: Emergency Medicine

## 2013-12-08 ENCOUNTER — Ambulatory Visit (HOSPITAL_COMMUNITY)
Admission: RE | Admit: 2013-12-08 | Discharge: 2013-12-08 | Disposition: A | Discharge status: Home / Self Care | Payer: BC Managed Care – PPO | Source: Ambulatory Visit | Attending: Orthopaedic Surgery | Admitting: Orthopaedic Surgery

## 2013-12-08 ENCOUNTER — Other Ambulatory Visit (HOSPITAL_COMMUNITY): Payer: Self-pay | Admitting: Orthopaedic Surgery

## 2013-12-08 ENCOUNTER — Encounter (HOSPITAL_COMMUNITY): Payer: Self-pay | Admitting: *Deleted

## 2013-12-08 DIAGNOSIS — M7989 Other specified soft tissue disorders: Principal | ICD-10-CM

## 2013-12-08 DIAGNOSIS — I82401 Acute embolism and thrombosis of unspecified deep veins of right lower extremity: Secondary | ICD-10-CM | POA: Insufficient documentation

## 2013-12-08 DIAGNOSIS — M79661 Pain in right lower leg: Secondary | ICD-10-CM

## 2013-12-08 DIAGNOSIS — M1990 Unspecified osteoarthritis, unspecified site: Secondary | ICD-10-CM | POA: Diagnosis not present

## 2013-12-08 DIAGNOSIS — M79609 Pain in unspecified limb: Secondary | ICD-10-CM

## 2013-12-08 DIAGNOSIS — Z87891 Personal history of nicotine dependence: Secondary | ICD-10-CM | POA: Diagnosis not present

## 2013-12-08 DIAGNOSIS — Z96641 Presence of right artificial hip joint: Secondary | ICD-10-CM | POA: Insufficient documentation

## 2013-12-08 DIAGNOSIS — Z9889 Other specified postprocedural states: Secondary | ICD-10-CM | POA: Diagnosis not present

## 2013-12-08 LAB — COMPREHENSIVE METABOLIC PANEL
ALBUMIN: 3.4 g/dL — AB (ref 3.5–5.2)
ALT: 22 U/L (ref 0–35)
AST: 20 U/L (ref 0–37)
Alkaline Phosphatase: 94 U/L (ref 39–117)
Anion gap: 12 (ref 5–15)
BUN: 15 mg/dL (ref 6–23)
CALCIUM: 9.2 mg/dL (ref 8.4–10.5)
CO2: 26 mEq/L (ref 19–32)
CREATININE: 0.8 mg/dL (ref 0.50–1.10)
Chloride: 100 mEq/L (ref 96–112)
GFR calc Af Amer: >90 mL/min (ref >90)
GFR, EST NON AFRICAN AMERICAN: 80 mL/min — AB (ref >90)
Glucose, Bld: 97 mg/dL (ref 70–99)
Potassium: 4.2 mEq/L (ref 3.7–5.3)
SODIUM: 138 meq/L (ref 137–147)
Total Bilirubin: 0.8 mg/dL (ref 0.3–1.2)
Total Protein: 6.5 g/dL (ref 6.0–8.3)

## 2013-12-08 LAB — CBC WITH DIFFERENTIAL/PLATELET
Basophils Absolute: 0.1 10*3/uL (ref 0.0–0.1)
Basophils Relative: 1 % (ref 0–1)
EOS PCT: 11 % — AB (ref 0–5)
Eosinophils Absolute: 0.6 10*3/uL (ref 0.0–0.7)
HEMATOCRIT: 25.9 % — AB (ref 36.0–46.0)
Hemoglobin: 8.9 g/dL — ABNORMAL LOW (ref 12.0–15.0)
LYMPHS PCT: 22 % (ref 12–46)
Lymphs Abs: 1.2 10*3/uL (ref 0.7–4.0)
MCH: 34.8 pg — ABNORMAL HIGH (ref 26.0–34.0)
MCHC: 34.4 g/dL (ref 30.0–36.0)
MCV: 101.2 fL — ABNORMAL HIGH (ref 78.0–100.0)
Monocytes Absolute: 0.5 10*3/uL (ref 0.1–1.0)
Monocytes Relative: 10 % (ref 3–12)
NEUTROS ABS: 3.2 10*3/uL (ref 1.7–7.7)
Neutrophils Relative %: 56 % (ref 43–77)
PLATELETS: 431 10*3/uL — AB (ref 150–400)
RBC: 2.56 MIL/uL — ABNORMAL LOW (ref 3.87–5.11)
RDW: 12.7 % (ref 11.5–15.5)
WBC: 5.6 10*3/uL (ref 4.0–10.5)

## 2013-12-08 LAB — PROTIME-INR
INR: 0.99 (ref 0.00–1.49)
PROTHROMBIN TIME: 13.2 s (ref 11.6–15.2)

## 2013-12-08 MED ORDER — RIVAROXABAN 15 MG PO TABS
15.0000 mg | ORAL_TABLET | Freq: Once | ORAL | Status: AC
  Administered 2013-12-08: 15 mg via ORAL
  Filled 2013-12-08: qty 1

## 2013-12-08 MED ORDER — RIVAROXABAN 15 MG PO TABS: 15.0000 mg | ORAL_TABLET | Freq: Two times a day (BID) | ORAL | Status: DC

## 2013-12-08 MED ORDER — RIVAROXABAN 20 MG PO TABS: 20.0000 mg | ORAL_TABLET | Freq: Every day | ORAL | Status: DC

## 2013-12-08 MED ORDER — RIVAROXABAN 15 MG PO TABS
15.0000 mg | ORAL_TABLET | Freq: Two times a day (BID) | ORAL | Status: DC
  Filled 2013-12-08: qty 1

## 2013-12-08 NOTE — ED Provider Notes (Signed)
CSN: [id number]     Arrival date & time 12/08/13  1658 History   First MD Initiated Contact with Patient 12/08/13 1902     Chief Complaint  Patient presents with  . DVT      HPI The pt just had a rt hip replacement oct 27. Since Sunday she has had pain and swelling in her entire rt leg with swelling.. She just arrived here from [place] that diagnosed her with a dvt of her rt leg.  Patient denies chest pain or shortness of breath.  Denies syncope or near syncope. Past Medical History  Diagnosis Date  . BRCA gene positive 06/28/2008    did not tolerate Tamoxifen - saw Dr. Lamonte Sakai  . Herniated lumbar disc without myelopathy     L 3-4-5  . PONV (postoperative nausea and vomiting)     developed rash with knee replacement unsure of cause   . Arthritis    Past Surgical History  Procedure Laterality Date  . Cervical spine surgery  1993    hernaited disc repair  . Total knee arthroplasty Right 2007  . Abdominal hysterectomy  2002    fibroids, DUB  . Tubal ligation Bilateral 1987  . Salpingoophorectomy Right 1988    secondary to cyst  . Salpingoophorectomy Left 09/2008    secondary to BRCA +  . Retinal detachment surgery Right 1993  . Joint replacement      right knee  . Foot surgery      let foot  . Colonoscopy    . Esophagogastroduodenoscopy    . Lumbar fusion  10/2012    L3.L4,L5,L6, fusion and repair  . Back surgery    . Eye surgery    . Total hip arthroplasty Right 11/28/2013    Procedure: TOTAL HIP ARTHROPLASTY ANTERIOR APPROACH;  Surgeon: Hessie Dibble, MD;  Location: [place];  Service: Orthopedics;  Laterality: Right;   Family History  Problem Relation Age of Onset  . Ovarian cancer Paternal Grandmother   . Breast cancer Cousin     paternal  . Ovarian cancer Cousin     paternal  . Cervical cancer Cousin     paternal  . Breast cancer Cousin     paternal  . Leukemia Daughter    History  Substance Use Topics  . Smoking status: Former Smoker -- 0.50 packs/day for 4  years    Types: Cigarettes    Quit date: 02/03/1976  . Smokeless tobacco: Never Used     Comment: for a while in college  . Alcohol Use: 2.4 oz/week    4 Glasses of wine per week   OB History    Gravida Para Term Preterm AB TAB SAB Ectopic Multiple Living   _0 Review of Systems  All other systems reviewed and are negative  Allergies  Review of patient's allergies indicates no known allergies.  Home Medications   Prior to Admission medications   Medication Sig Start Date End Date Taking? Authorizing Provider  acetaminophen (TYLENOL) 500 MG tablet Take 1,000 mg by mouth every 6 (six) hours as needed.   Yes Historical Provider, MD  methocarbamol (ROBAXIN) 500 MG tablet Take 1 tablet (500 mg total) by mouth every 6 (six) hours as needed for muscle spasms. 11/29/13  Yes Larwance Sachs Nida, PA-C  HYDROcodone-acetaminophen (NORCO/VICODIN) 5-325 MG per tablet Take 1-2 tablets by mouth every 4 (four) hours as needed (breakthrough pain). 11/29/13   Mitzi Hansen  Vida Rigger, PA-C  Rivaroxaban (XARELTO) 15 MG TABS tablet Take 1 tablet (15 mg total) by mouth 2 (two) times daily with a meal. 12/08/13 12/29/13  Dot Lanes, MD  rivaroxaban (XARELTO) 20 MG TABS tablet Take 1 tablet (20 mg total) by mouth daily with supper. 12/30/13   Dot Lanes, MD   BP 114/70 mmHg  Pulse 82  Temp(Src) 98.1 F (36.7 C) (Oral)  Resp 21  SpO2 100%  LMP 02/03/2000 Physical Exam Physical Exam  Nursing note and vitals reviewed. Constitutional: She is oriented to person, place, and time. She appears well-developed and well-nourished. No distress.  HENT:  Head: Normocephalic and atraumatic.  Eyes: Pupils are equal, round, and reactive to light.  Neck: Normal range of motion.  Cardiovascular: Normal rate and intact distal pulses.   Pulmonary/Chest: No respiratory distress.  Abdominal: Normal appearance. She exhibits no distension.  Musculoskeletal: Normal range of motion. she has swelling to the  right lower extremity and right thigh area.  She has good pedal pulses.  No particular pain.  No discoloration. Neurological: She is alert and oriented to person, place, and time. No cranial nerve deficit. No numbness Skin: Skin is warm and dry. No rash noted.  Psychiatric: She has a normal mood and affect. Her behavior is normal.   ED Course  Procedures (including critical care time)  Medications  Rivaroxaban (XARELTO) tablet 15 mg (15 mg Oral Given 12/08/13 2008)    Labs Review Labs Reviewed  CBC WITH DIFFERENTIAL - Abnormal; Notable for the following:    RBC 2.56 (*)    Hemoglobin 8.9 (*)    HCT 25.9 (*)    MCV 101.2 (*)    MCH 34.8 (*)    Platelets 431 (*)    Eosinophils Relative 11 (*)    All other components within normal limits  COMPREHENSIVE METABOLIC PANEL - Abnormal; Notable for the following:    Albumin 3.4 (*)    GFR calc non Af Amer 80 (*)    All other components within normal limits  PROTIME-INR    Imaging Review No results found.    MDM   Final diagnoses:  Deep venous thrombosis of leg, right        Dot Lanes, MD 12/10/13 1435

## 2013-12-08 NOTE — ED Notes (Signed)
The pt just had a rt hip replacement oct 27.   Since Sunday she has had pain and swelling in her entire rt leg with swelling..  [place] with a dvt of her rt leg

## 2013-12-08 NOTE — Discharge Instructions (Addendum)
Information on my medicine - XARELTO (rivaroxaban)  This medication education was reviewed with me or my healthcare representative as part of my discharge preparation.  The pharmacist that spoke with me during my hospital stay was:  Roseanne Reno, Cassie L, RPH  WHY WAS XARELTO PRESCRIBED FOR YOU? Xarelto was prescribed to treat blood clots that may have been found in the veins of your legs (deep vein thrombosis) or in your lungs (pulmonary embolism) and to reduce the risk of them occurring again.  What do you need to know about Xarelto? The starting dose is one 15 mg tablet taken TWICE daily with food for the FIRST 21 DAYS then on 11/28 the dose is changed to one 20 mg tablet taken ONCE A DAY with your evening meal.  DO NOT stop taking Xarelto without talking to the health care provider who prescribed the medication.  Refill your prescription for 20 mg tablets before you run out.  After discharge, you should have regular check-up appointments with your healthcare provider that is prescribing your Xarelto.  In the future your dose may need to be changed if your kidney function changes by a significant amount.  What do you do if you miss a dose? If you are taking Xarelto TWICE DAILY and you miss a dose, take it as soon as you remember. You may take two 15 mg tablets (total 30 mg) at the same time then resume your regularly scheduled 15 mg twice daily the next day.  If you are taking Xarelto ONCE DAILY and you miss a dose, take it as soon as you remember on the same day then continue your regularly scheduled once daily regimen the next day. Do not take two doses of Xarelto at the same time.   Important Safety Information Xarelto is a blood thinner medicine that can cause bleeding. You should call your healthcare provider right away if you experience any of the following: ? Bleeding from an injury or your nose that does not stop. ? Unusual colored urine (red or dark brown) or unusual colored  stools (red or black). ? Unusual bruising for unknown reasons. ? A serious fall or if you hit your head (even if there is no bleeding).  Some medicines may interact with Xarelto and might increase your risk of bleeding while on Xarelto. To help avoid this, consult your healthcare provider or pharmacist prior to using any new prescription or non-prescription medications, including herbals, vitamins, non-steroidal anti-inflammatory drugs (NSAIDs) and supplements.  This website has more information on Xarelto: VisitDestination.com.br.Deep Vein Thrombosis A deep vein thrombosis (DVT) is a blood clot that develops in the deep, larger veins of the leg, arm, or pelvis. These are more dangerous than clots that might form in veins near the surface of the body. A DVT can lead to serious and even life-threatening complications if the clot breaks off and travels in the bloodstream to the lungs.  A DVT can damage the valves in your leg veins so that instead of flowing upward, the blood pools in the lower leg. This is called post-thrombotic syndrome, and it can result in pain, swelling, discoloration, and sores on the leg. CAUSES Usually, several things contribute to the formation of blood clots. Contributing factors include:  The flow of blood slows down.  The inside of the vein is damaged in some way.  You have a condition that makes blood clot more easily. RISK FACTORS Some people are more likely than others to develop blood clots. Risk factors include:  Smoking.  Being overweight (obese).  Sitting or lying still for a long time. This includes long-distance travel, paralysis, or recovery from an illness or surgery. Other factors that increase risk are:   Older age, especially over 58 years of age.  Having a family history of blood clots or if you have already had a blot clot.  Having major or lengthy surgery. This is especially true for surgery on the hip, knee, or belly (abdomen). Hip surgery is  particularly high risk.  Having a long, thin tube (catheter) placed inside a vein during a medical procedure.  Breaking a hip or leg.  Having cancer or cancer treatment.  Pregnancy and childbirth.  Hormone changes make the blood clot more easily during pregnancy.  The fetus puts pressure on the veins of the pelvis.  There is a risk of injury to veins during delivery or a caesarean delivery. The risk is highest just after childbirth.  Medicines containing the female hormone estrogen. This includes birth control pills and hormone replacement therapy.  Other circulation or heart problems.  SIGNS AND SYMPTOMS When a clot forms, it can either partially or totally block the blood flow in that vein. Symptoms of a DVT can include:  Swelling of the leg or arm, especially if one side is much worse.  Warmth and redness of the leg or arm, especially if one side is much worse.  Pain in an arm or leg. If the clot is in the leg, symptoms may be more noticeable or worse when standing or walking. The symptoms of a DVT that has traveled to the lungs (pulmonary embolism, PE) usually start suddenly and include:  Shortness of breath.  Coughing.  Coughing up blood or blood-tinged mucus.  Chest pain. The chest pain is often worse with deep breaths.  Rapid heartbeat. Anyone with these symptoms should get emergency medical treatment right away. Do not wait to see if the symptoms will go away. Call your local emergency services (911 in the [place] if you have these symptoms. Do not drive yourself to the hospital. DIAGNOSIS If a DVT is suspected, your health care provider will take a full medical history and perform a physical exam. Tests that also may be required include:  Blood tests, including studies of the clotting properties of the blood.  Ultrasound to see if you have clots in your legs or lungs.  X-rays to show the flow of blood when dye is injected into the veins (venogram).  Studies of  your lungs if you have any chest symptoms. PREVENTION  Exercise the legs regularly. Take a brisk 30-minute walk every day.  Maintain a weight that is appropriate for your height.  Avoid sitting or lying in bed for long periods of time without moving your legs.  Women, particularly those over the age of 35 years, should consider the risks and benefits of taking estrogen medicines, including birth control pills.  Do not smoke, especially if you take estrogen medicines.  Long-distance travel can increase your risk of DVT. You should exercise your legs by walking or pumping the muscles every hour.  Many of the risk factors above relate to situations that exist with hospitalization, either for illness, injury, or elective surgery. Prevention may include medical and nonmedical measures.  Your health care provider will assess you for the need for venous thromboembolism prevention when you are admitted to the hospital. If you are having surgery, your surgeon will assess you the day of or day after surgery. TREATMENT Once identified,  a DVT can be treated. It can also be prevented in some circumstances. Once you have had a DVT, you may be at increased risk for a DVT in the future. The most common treatment for DVT is blood-thinning (anticoagulant) medicine, which reduces the blood's tendency to clot. Anticoagulants can stop new blood clots from forming and stop old clots from growing. They cannot dissolve existing clots. Your body does this by itself over time. Anticoagulants can be given by mouth, through an IV tube, or by injection. Your health care provider will determine the best program for you. Other medicines or treatments that may be used are:  Heparin or related medicines (low molecular weight heparin) are often the first treatment for a blood clot. They act quickly. However, they cannot be taken orally and must be given either in shot form or by IV tube.  Heparin can cause a fall in a  component of blood that stops bleeding and forms blood clots (platelets). You will be monitored with blood tests to be sure this does not occur.  Warfarin is an anticoagulant that can be swallowed. It takes a few days to start working, so usually heparin or related medicines are used in combination. Once warfarin is working, heparin is usually stopped.  Factor Xa inhibitor medicines, such as rivaroxaban and apixaban, also reduce blood clotting. These medicines are taken orally and can often be used without heparin or related medicines.  Less commonly, clot dissolving drugs (thrombolytics) are used to dissolve a DVT. They carry a high risk of bleeding, so they are used mainly in severe cases where your life or a part of your body is threatened.  Very rarely, a blood clot in the leg needs to be removed surgically.  If you are unable to take anticoagulants, your health care provider may arrange for you to have a filter placed in a main vein in your abdomen. This filter prevents clots from traveling to your lungs. HOME CARE INSTRUCTIONS  Take all medicines as directed by your health care provider.  Learn as much as you can about DVT.  Wear a medical alert bracelet or carry a medical alert card.  Ask your health care provider how soon you can go back to normal activities. It is important to stay active to prevent blood clots. If you are on anticoagulant medicine, avoid contact sports.  It is very important to exercise. This is especially important while traveling, sitting, or standing for long periods of time. Exercise your legs by walking or by tightening and relaxing your leg muscles regularly. Take frequent walks.  You may need to wear compression stockings. These are tight elastic stockings that apply pressure to the lower legs. This pressure can help keep the blood in the legs from clotting. Taking Warfarin Warfarin is a daily medicine that is taken by mouth. Your health care provider will  advise you on the length of treatment (usually 3-6 months, sometimes lifelong). If you take warfarin:  Understand how to take warfarin and foods that can affect how warfarin works in Public relations account executive.  Too much and too little warfarin are both dangerous. Too much warfarin increases the risk of bleeding. Too little warfarin continues to allow the risk for blood clots. Warfarin and Regular Blood Testing While taking warfarin, you will need to have regular blood tests to measure your blood clotting time. These blood tests usually include both the prothrombin time (PT) and international normalized ratio (INR) tests. The PT and INR results allow your health  care provider to adjust your dose of warfarin. It is very important that you have your PT and INR tested as often as directed by your health care provider.  Warfarin and Your Diet Avoid major changes in your diet, or notify your health care provider before changing your diet. Arrange a visit with a registered dietitian to answer your questions. Many foods, especially foods high in vitamin K, can interfere with warfarin and affect the PT and INR results. You should eat a consistent amount of foods high in vitamin K. Foods high in vitamin K include:   Spinach, kale, broccoli, cabbage, collard and turnip greens, Brussels sprouts, peas, cauliflower, seaweed, and parsley.  Beef and pork liver.  Green tea.  Soybean oil. Warfarin with Other Medicines Many medicines can interfere with warfarin and affect the PT and INR results. You must:  Tell your health care provider about any and all medicines, vitamins, and supplements you take, including aspirin and other over-the-counter anti-inflammatory medicines. Be especially cautious with aspirin and anti-inflammatory medicines. Ask your health care provider before taking these.  Do not take or discontinue any prescribed or over-the-counter medicine except on the advice of your health care provider or  pharmacist. Warfarin Side Effects Warfarin can have side effects, such as easy bruising and difficulty stopping bleeding. Ask your health care provider or pharmacist about other side effects of warfarin. You will need to:  Hold pressure over cuts for longer than usual.  Notify your dentist and other health care providers that you are taking warfarin before you undergo any procedures where bleeding may occur. Warfarin with Alcohol and Tobacco   Drinking alcohol frequently can increase the effect of warfarin, leading to excess bleeding. It is best to avoid alcoholic drinks or to consume only very small amounts while taking warfarin. Notify your health care provider if you change your alcohol intake.   Do not use any tobacco products including cigarettes, chewing tobacco, or electronic cigarettes. If you smoke, quit. Ask your health care provider for help with quitting smoking. Alternative Medicines to Warfarin: Factor Xa Inhibitor Medicines  These blood-thinning medicines are taken by mouth, usually for several weeks or longer. It is important to take the medicine every single day at the same time each day.  There are no regular blood tests required when using these medicines.  There are fewer food and drug interactions than with warfarin.  The side effects of this class of medicine are similar to those of warfarin, including excessive bruising or bleeding. Ask your health care provider or pharmacist about other potential side effects. SEEK MEDICAL CARE IF:  You notice a rapid heartbeat.  You feel weaker or more tired than usual.  You feel faint.  You notice increased bruising.  You feel your symptoms are not getting better in the time expected.  You believe you are having side effects of medicine. SEEK IMMEDIATE MEDICAL CARE IF:  You have chest pain.  You have trouble breathing.  You have new or increased swelling or pain in one leg.  You cough up blood.  You notice blood  in vomit, in a bowel movement, or in urine. MAKE SURE YOU:  Understand these instructions.  Will watch your condition.  Will get help right away if you are not doing well or get worse. Document Released: 01/19/2005 Document Revised: 06/05/2013 Document Reviewed: 09/26/2012 [facility] Patient Information 2015 [employer] [place]. This information is not intended to replace advice given to you by your health care provider. Make sure you  discuss any questions you have with your health care provider. ° °

## 2013-12-08 NOTE — Progress Notes (Signed)
*  PRELIMINARY RESULTS* Vascular Ultrasound Lower extremity venous duplex has been completed.  Preliminary findings:  Evidence of DVT involving the right profunda femoral vein. Right Common femoral vein demonstrates continuous flow compared to phasicity of Left Common femoral vein, suggesting a more proximal obstruction, possibly in the right iliac vein.  [place]. He instructed patient to go to ED for treatment.    Farrel DemarkJill Eunice, RDMS, RVT  12/08/2013, 4:43 PM

## 2014-03-27 ENCOUNTER — Telehealth: Payer: Self-pay | Admitting: *Deleted

## 2014-03-27 NOTE — Telephone Encounter (Signed)
Nicole Casey, I have not found the prescription yet. It should have been sent in to be scanned but I am unable to find it in her chart. If I come across it, I will bring it to you.

## 2014-03-27 NOTE — Telephone Encounter (Signed)
Pt request refill of cream for her toe.

## 2014-04-02 NOTE — Telephone Encounter (Signed)
She just needs a combination neuropathy/pain cream-  Why not call [facility] and verbally ok a refill- they will have a copy of the rx.  Thanks!  E

## 2014-04-04 NOTE — Telephone Encounter (Signed)
I left [facility] a message to return my call.  I need to see if patient has been receiving compound cream from them.  If so, need to order refills.

## 2014-04-11 ENCOUNTER — Telehealth: Payer: Self-pay | Admitting: *Deleted

## 2014-04-11 NOTE — Telephone Encounter (Signed)
I called to see if patient got her prescription filled by [facility].  They stated she never got it filled.

## 2014-04-12 NOTE — Telephone Encounter (Addendum)
I left patient messages to call me back.  I need to know the name of the medicine patient is requesting a refill for and the name of Casey.  I returned her call.  We're trying to figure out what cream you need a refill on.  "I never got the cream due to the cost.  My toe is killing me.  I had the surgery thinking that would solve it but it didn't.  I've spoken to other doctors and was told to face the fact that I am aging.  That is not acceptable, something is going on.  So, I guess I will go this route."  Okay, I will send another prescription to Nicole Casey, you should hear something from them.

## 2014-04-12 NOTE — Telephone Encounter (Signed)
I called and asked them to fill the prescription that was previously ordered on 05/02/2013.  He stated he would contact the patient and will call if there is any insurance issues.

## 2014-07-19 ENCOUNTER — Other Ambulatory Visit: Payer: Self-pay

## 2014-07-19 DIAGNOSIS — Z1231 Encounter for screening mammogram for malignant neoplasm of breast: Secondary | ICD-10-CM

## 2014-08-27 ENCOUNTER — Ambulatory Visit: Payer: Self-pay

## 2014-09-13 ENCOUNTER — Ambulatory Visit
Admission: RE | Admit: 2014-09-13 | Discharge: 2014-09-13 | Disposition: A | Discharge status: Home / Self Care | Payer: BLUE CROSS/BLUE SHIELD | Source: Ambulatory Visit

## 2014-09-13 DIAGNOSIS — Z1231 Encounter for screening mammogram for malignant neoplasm of breast: Secondary | ICD-10-CM

## 2014-10-04 ENCOUNTER — Ambulatory Visit: Payer: BC Managed Care – PPO | Admitting: Nurse Practitioner

## 2014-10-12 ENCOUNTER — Ambulatory Visit (INDEPENDENT_AMBULATORY_CARE_PROVIDER_SITE_OTHER): Payer: BLUE CROSS/BLUE SHIELD | Admitting: Podiatry

## 2014-10-12 ENCOUNTER — Ambulatory Visit (INDEPENDENT_AMBULATORY_CARE_PROVIDER_SITE_OTHER): Payer: BLUE CROSS/BLUE SHIELD

## 2014-10-12 ENCOUNTER — Encounter: Payer: Self-pay | Admitting: Podiatry

## 2014-10-12 VITALS — BP 111/62 | HR 74 | Resp 16

## 2014-10-12 DIAGNOSIS — M779 Enthesopathy, unspecified: Secondary | ICD-10-CM

## 2014-10-12 DIAGNOSIS — M2041 Other hammer toe(s) (acquired), right foot: Secondary | ICD-10-CM

## 2014-10-12 DIAGNOSIS — M79674 Pain in right toe(s): Secondary | ICD-10-CM

## 2014-10-12 MED ORDER — TRIAMCINOLONE ACETONIDE 10 MG/ML IJ SUSP
10.0000 mg | Freq: Once | INTRAMUSCULAR | Status: AC
  Administered 2014-10-12: 10 mg

## 2014-10-12 NOTE — Progress Notes (Signed)
   Subjective:    Patient ID: Nicole Casey, female    DOB: [birth year], 59 y.o.   MRN: [id number]  HPI Patient presents with toe pain in their Right foot, 2nd toe, black and blue, mainly hurts at base and back of toe. Pt stated, "does not know how this happened", x3 weeks.  Patient also bilateral callouses. On the left foot, medial and lateral sides, x2 months. Pt has used neosporin with some relief. On the right foot- top of great toe.  Review of Systems  Musculoskeletal: Positive for arthralgias.  Skin: Positive for rash.  All other systems reviewed and are negative.      Objective:   Physical Exam        Assessment & Plan:

## 2014-10-15 NOTE — Progress Notes (Signed)
Subjective:     Patient ID: Nicole Casey, female   DOB: [birth year], 59 y.o.   MRN: [id number]  HPI patient has developed pain around the right second MPJ with movement of the toe in a medial direction and states that it has been increasingly tender over the last month. She did have surgery on her left second MPJ and also the left bunion deformity and it kind of occurred the same way. Been present for around a month   Review of Systems  All other systems reviewed and are negative.      Objective:   Physical Exam  Constitutional: She is oriented to person, place, and time.  Cardiovascular: Intact distal pulses.   Neurological: She is oriented to person, place, and time.  Skin: Skin is warm.  Nursing note and vitals reviewed.  neurovascular status intact muscle strength adequate with range of motion within normal limits. Patient's noted to have well-healed surgical scars dorsum left foot and on the right foot to have a medial and dorsal dislocation of the second toe with inflammation and fluid around the second MPJ. Patient again does not remember specifics of injury     Assessment:     Probable flexor plate stretch or tear occurring of the second MPJ right with previous history of condition on the left foot    Plan:     H&P and x-rays reviewed of condition and educated her on causes. Today were to try to treat conservatively even though ultimately may require digital fusion and met-type osteotomy. I went ahead today and I did a proximal nerve block and then aspirated the second MPJ getting out a small amount of clear fluid with a small amount of bleeding indicate a possible tear and then injected with a quarter cc dexamethasone Kenalog and applied thick plantar pad. Patient tolerated procedure well and will be seen back to review again in the next several weeks

## 2014-10-16 ENCOUNTER — Ambulatory Visit: Payer: Self-pay | Admitting: Nurse Practitioner

## 2014-10-23 ENCOUNTER — Ambulatory Visit: Payer: Self-pay | Admitting: Obstetrics and Gynecology

## 2014-10-26 ENCOUNTER — Ambulatory Visit (INDEPENDENT_AMBULATORY_CARE_PROVIDER_SITE_OTHER): Payer: BLUE CROSS/BLUE SHIELD | Admitting: Podiatry

## 2014-10-26 ENCOUNTER — Encounter: Payer: Self-pay | Admitting: Podiatry

## 2014-10-26 VITALS — BP 124/76 | HR 65 | Resp 12

## 2014-10-26 DIAGNOSIS — M779 Enthesopathy, unspecified: Secondary | ICD-10-CM | POA: Diagnosis not present

## 2014-10-26 DIAGNOSIS — M2041 Other hammer toe(s) (acquired), right foot: Secondary | ICD-10-CM | POA: Diagnosis not present

## 2014-10-28 NOTE — Progress Notes (Signed)
Subjective:     Patient ID: Nicole Casey, female   DOB: [birth year], 59 y.o.   MRN: [id number]  HPI patient presents stating the pain has improved but it is still present with inflammation around the joint surface with ambulation   Review of Systems     Objective:   Physical Exam Neurovascular status was found to be intact no change in health history with inflammation and pain right second MPJ which is still present but some improvement with medial dislocation of the second toe and mild elevation of the second toe    Assessment:     Inflammatory capsulitis second MPJ with probable flexor plate disruption noted    Plan:     Reviewed condition and recommended at this time orthotics of a Berkley type nature in order to provide for complete support of the heel the arch and reduce stress against the second MPJ. Patient is scanned for customized orthotics at this time

## 2014-12-21 ENCOUNTER — Ambulatory Visit (INDEPENDENT_AMBULATORY_CARE_PROVIDER_SITE_OTHER): Payer: BLUE CROSS/BLUE SHIELD | Admitting: Nurse Practitioner

## 2014-12-21 ENCOUNTER — Encounter: Payer: Self-pay | Admitting: Nurse Practitioner

## 2014-12-21 VITALS — BP 112/80 | HR 72 | Ht 65.75 in | Wt 175.0 lb

## 2014-12-21 DIAGNOSIS — Z01419 Encounter for gynecological examination (general) (routine) without abnormal findings: Secondary | ICD-10-CM | POA: Diagnosis not present

## 2014-12-21 DIAGNOSIS — Z1509 Genetic susceptibility to other malignant neoplasm: Secondary | ICD-10-CM

## 2014-12-21 DIAGNOSIS — Z1501 Genetic susceptibility to malignant neoplasm of breast: Secondary | ICD-10-CM

## 2014-12-21 DIAGNOSIS — Z0000 Encounter for general adult medical examination without abnormal findings: Secondary | ICD-10-CM | POA: Diagnosis not present

## 2014-12-21 DIAGNOSIS — Z8481 Family history of carrier of genetic disease: Secondary | ICD-10-CM | POA: Diagnosis not present

## 2014-12-21 DIAGNOSIS — Z1211 Encounter for screening for malignant neoplasm of colon: Secondary | ICD-10-CM

## 2014-12-21 LAB — CBC WITH DIFFERENTIAL/PLATELET
BASOS ABS: 0 10*3/uL (ref 0.0–0.1)
Basophils Relative: 1 % (ref 0–1)
EOS ABS: 0.1 10*3/uL (ref 0.0–0.7)
EOS PCT: 3 % (ref 0–5)
HEMATOCRIT: 37.7 % (ref 36.0–46.0)
Hemoglobin: 13.1 g/dL (ref 12.0–15.0)
Lymphocytes Relative: 33 % (ref 12–46)
Lymphs Abs: 1.6 10*3/uL (ref 0.7–4.0)
MCH: 34 pg (ref 26.0–34.0)
MCHC: 34.7 g/dL (ref 30.0–36.0)
MCV: 97.9 fL (ref 78.0–100.0)
MPV: 9.1 fL (ref 8.6–12.4)
Monocytes Absolute: 0.3 10*3/uL (ref 0.1–1.0)
Monocytes Relative: 7 % (ref 3–12)
Neutro Abs: 2.7 10*3/uL (ref 1.7–7.7)
Neutrophils Relative %: 56 % (ref 43–77)
PLATELETS: 286 10*3/uL (ref 150–400)
RBC: 3.85 MIL/uL — ABNORMAL LOW (ref 3.87–5.11)
RDW: 12.9 % (ref 11.5–15.5)
WBC: 4.9 10*3/uL (ref 4.0–10.5)

## 2014-12-21 LAB — POCT URINALYSIS DIPSTICK
Bilirubin, UA: NEGATIVE
GLUCOSE UA: NEGATIVE
KETONES UA: NEGATIVE
Nitrite, UA: NEGATIVE
Protein, UA: NEGATIVE
RBC UA: NEGATIVE
Urobilinogen, UA: NEGATIVE
pH, UA: 7

## 2014-12-21 LAB — LIPID PANEL
Cholesterol: 181 mg/dL (ref 125–200)
HDL: 89 mg/dL (ref >=46)
LDL CALC: 69 mg/dL (ref <130)
Total CHOL/HDL Ratio: 2 Ratio (ref <=5.0)
Triglycerides: 113 mg/dL (ref <150)
VLDL: 23 mg/dL (ref <30)

## 2014-12-21 LAB — COMPREHENSIVE METABOLIC PANEL
ALT: 18 U/L (ref 6–29)
AST: 19 U/L (ref 10–35)
Albumin: 4.4 g/dL (ref 3.6–5.1)
Alkaline Phosphatase: 71 U/L (ref 33–130)
BILIRUBIN TOTAL: 1.4 mg/dL — AB (ref 0.2–1.2)
BUN: 14 mg/dL (ref 7–25)
CO2: 28 mmol/L (ref 20–31)
Calcium: 9.5 mg/dL (ref 8.6–10.4)
Chloride: 101 mmol/L (ref 98–110)
Creat: 0.69 mg/dL (ref 0.50–1.05)
Glucose, Bld: 81 mg/dL (ref 65–99)
POTASSIUM: 4.2 mmol/L (ref 3.5–5.3)
SODIUM: 138 mmol/L (ref 135–146)
Total Protein: 6.8 g/dL (ref 6.1–8.1)

## 2014-12-21 LAB — TSH: TSH: 1.125 u[IU]/mL (ref 0.350–4.500)

## 2014-12-21 LAB — HEMOGLOBIN A1C
Hgb A1c MFr Bld: 5.6 % (ref <5.7)
Mean Plasma Glucose: 114 mg/dL (ref <117)

## 2014-12-21 NOTE — Progress Notes (Signed)
Patient ID: Nicole Casey, female   DOB: 1955-04-02, 59 y.o.   MRN: [id number]  59 y.o. G55P2002 Married  Caucasian Fe here for annual exam.  No new health diagnosis.  Brother passed in November 11/2014 and father passed in  October 16, 2014.   Her mother now in a Nursing Home due to Alzheimer's.  Patient's last menstrual period was 02/03/2000.          Sexually active: Yes.    The current method of family planning is post menopausal status and status post hysterectomy.    Exercising: Yes.    Gym/ health club routine includes aerobics and weights 5 times per week.  Walking with goal of 30 miles per week.. Smoker:  no  Health Maintenance: Pap: 09/24/11, WNL, neg HR HPV  MMG: 09/13/14, BI-Rads 1: Negative  Colonoscopy: 10/23/09 normal repeat in 10 years  BMD: 08/10/08 T Score 1.0 Spine / -0.5 Left Femur Neck TDaP: 09/19/10  Labs: done here  Urine: trace leuk's   reports that she quit smoking about 38 years ago. Her smoking use included Cigarettes. She has a 2 pack-year smoking history. She has never used smokeless tobacco. She reports that she drinks about 2.4 oz of alcohol per week. She reports that she does not use illicit drugs.  Past Medical History  Diagnosis Date  . BRCA gene positive 06/28/2008    did not tolerate Tamoxifen - saw Dr. Lamonte Sakai  . Herniated lumbar disc without myelopathy     L 3-4-5  . PONV (postoperative nausea and vomiting)     developed rash with knee replacement unsure of cause   . Arthritis     Past Surgical History  Procedure Laterality Date  . Cervical spine surgery  1993    hernaited disc repair  . Total knee arthroplasty Right 2007  . Abdominal hysterectomy  2002    fibroids, DUB  . Tubal ligation Bilateral 1987  . Salpingoophorectomy Right 1988    secondary to cyst  . Salpingoophorectomy Left 09/2008    secondary to BRCA +  . Retinal detachment surgery Right 1993  . Joint replacement      right knee  . Foot surgery      let foot  . Colonoscopy     . Esophagogastroduodenoscopy    . Lumbar fusion  10/2012    L3.L4,L5,L6, fusion and repair  . Back surgery    . Eye surgery    . Total hip arthroplasty Right 11/28/2013    Procedure: TOTAL HIP ARTHROPLASTY ANTERIOR APPROACH;  Surgeon: Hessie Dibble, MD;  Location: [facility];  Service: Orthopedics;  Laterality: Right;    No current outpatient prescriptions on file.   No current facility-administered medications for this visit.    Family History  Problem Relation Age of Onset  . Ovarian cancer Paternal Grandmother   . Breast cancer Cousin     paternal  . Ovarian cancer Cousin     paternal  . Cervical cancer Cousin     paternal  . Breast cancer Cousin     paternal  . Leukemia Daughter   . Cancer Brother   . Melanoma Sister     ROS:  Pertinent items are noted in HPI.  Otherwise, a comprehensive ROS was negative.  Exam:   BP 112/80 mmHg  Pulse 72  Ht 5' 5.75" (1.67 m)  Wt 175 lb (79.379 kg)  BMI 28.46 kg/m2  LMP 02/03/2000 Height: 5' 5.75" (167 cm) Ht Readings from Last 3 Encounters:  12/21/14 5' 5.75" (1.67 m)  11/16/13 5' 6.5" (1.689 m)  09/29/13 5' 6.5" (1.689 m)    General appearance: alert, cooperative and appears stated age Head: Normocephalic, without obvious abnormality, atraumatic Neck: no adenopathy, supple, symmetrical, trachea midline and thyroid normal to inspection and palpation Lungs: clear to auscultation bilaterally Breasts: normal appearance, no masses or tenderness Heart: regular rate and rhythm Abdomen: soft, non-tender; no masses,  no organomegaly Extremities: extremities normal, atraumatic, no cyanosis or edema Skin: Skin color, texture, turgor normal. No rashes or lesions Lymph nodes: Cervical, supraclavicular, and axillary nodes normal. No abnormal inguinal nodes palpated Neurologic: Grossly normal   Pelvic: External genitalia:  no lesions              Urethra:  normal appearing urethra with no masses, tenderness or lesions               Bartholin's and Skene's: normal                 Vagina: normal appearing vagina with normal color and discharge, no lesions              Cervix: absent              Pap taken: Yes.   Bimanual Exam:  Uterus:  uterus absent              Adnexa: no mass, fullness, tenderness               Rectovaginal: Confirms               Anus:  normal sphincter tone, no lesions  Chaperone present: yes  A:  Well Woman with normal exam  Postmenopausal no HRT Strong [place] of OV, Breast cancer, cervical cancer, omentum cancer S/P TAH 2002 secondary to fibroids and DUB  S/P BSO 1988 secondary to cyst & 09/2008 secondary to BRCA I + gene Patient with BRCA I + = unable to tolerate Tamoxifen 2/12 (saw Dr. 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  Grief reaction with loss of father and brother, mom now in [place] on breast self exam, mammography screening, adequate intake of calcium and vitamin D, diet and exercise, Kegel's exercises return annually or prn  An After Visit Summary was printed and given to the patient.

## 2014-12-21 NOTE — Patient Instructions (Signed)

## 2014-12-22 LAB — VITAMIN D 25 HYDROXY (VIT D DEFICIENCY, FRACTURES): Vit D, 25-Hydroxy: 25 ng/mL — ABNORMAL LOW (ref 30–100)

## 2014-12-24 NOTE — Progress Notes (Signed)
Encounter reviewed by Dr. Brook Amundson C. Silva.  

## 2014-12-25 ENCOUNTER — Telehealth: Payer: Self-pay | Admitting: *Deleted

## 2014-12-25 DIAGNOSIS — R17 Unspecified jaundice: Secondary | ICD-10-CM

## 2014-12-25 LAB — IPS PAP TEST WITH HPV

## 2014-12-25 NOTE — Telephone Encounter (Signed)
-----   Message from Ria CommentPatricia Grubb, FNP sent at 12/24/2014  8:29 AM EST ----- Please let pt. Know that Vit D is low again - this year 25 compared to 46 and 51 in the past.  She may take Vit D per protocol.  The HGB AIC, lipid panel, CMP is normal except for elevated T. Bilirubin.  She must stay off NSAID's and any ETOH for 2 weeks and then recheck here.  TSH and CBC is normal.

## 2014-12-25 NOTE — Telephone Encounter (Signed)
I have attempted to contact this patient by phone with the following results: left message to return call to [place]phanie at [phone] on answering machine (mobile [facility]).  No personal information given.  [phone] (Mobile) *Preferred*  Advised pt to ask for Barbee CoughReina if she returns call on Wednesday as I will be out of the office.

## 2014-12-26 NOTE — Telephone Encounter (Signed)
Patient is returning a call to [place]phanie. She was told to ask for [place]ina.

## 2014-12-26 NOTE — Telephone Encounter (Signed)
Returning a call to Reina °

## 2014-12-26 NOTE — Telephone Encounter (Signed)
Left voice mail to call back 

## 2014-12-26 NOTE — Telephone Encounter (Signed)
Pt notified. Verbalized understanding. Will start OTC Vit D 800IU daily  Lab appt to recheck CMP 01/08/15 8:30 am Future CMP ordered.   Mrs Joaquin Musicatty FYI - Encounter closed.

## 2015-01-04 ENCOUNTER — Telehealth: Payer: Self-pay | Admitting: Nurse Practitioner

## 2015-01-04 NOTE — Telephone Encounter (Signed)
Patient canceled her  CMP labs appointment 01/08/15 and rescheduled to 02/05/14.

## 2015-01-08 ENCOUNTER — Other Ambulatory Visit: Payer: BLUE CROSS/BLUE SHIELD

## 2015-02-03 DIAGNOSIS — D0362 Melanoma in situ of left upper limb, including shoulder: Secondary | ICD-10-CM

## 2015-02-03 HISTORY — DX: Melanoma in situ of left upper limb, including shoulder: D03.62

## 2015-02-06 ENCOUNTER — Other Ambulatory Visit: Payer: BLUE CROSS/BLUE SHIELD

## 2015-02-08 ENCOUNTER — Other Ambulatory Visit (INDEPENDENT_AMBULATORY_CARE_PROVIDER_SITE_OTHER): Payer: BLUE CROSS/BLUE SHIELD

## 2015-02-08 DIAGNOSIS — R17 Unspecified jaundice: Secondary | ICD-10-CM

## 2015-02-08 LAB — COMPREHENSIVE METABOLIC PANEL
ALT: 21 U/L (ref 6–29)
AST: 21 U/L (ref 10–35)
Albumin: 4.3 g/dL (ref 3.6–5.1)
Alkaline Phosphatase: 67 U/L (ref 33–130)
BILIRUBIN TOTAL: 1.4 mg/dL — AB (ref 0.2–1.2)
BUN: 16 mg/dL (ref 7–25)
CALCIUM: 9.6 mg/dL (ref 8.6–10.4)
CO2: 28 mmol/L (ref 20–31)
Chloride: 100 mmol/L (ref 98–110)
Creat: 0.84 mg/dL (ref 0.50–1.05)
Glucose, Bld: 89 mg/dL (ref 65–99)
POTASSIUM: 4.5 mmol/L (ref 3.5–5.3)
Sodium: 136 mmol/L (ref 135–146)
Total Protein: 6.6 g/dL (ref 6.1–8.1)

## 2015-03-13 ENCOUNTER — Emergency Department (HOSPITAL_COMMUNITY)
Admission: EM | Admit: 2015-03-13 | Discharge: 2015-03-13 | Disposition: A | Discharge status: Home / Self Care | Payer: BLUE CROSS/BLUE SHIELD | Attending: Emergency Medicine | Admitting: Emergency Medicine

## 2015-03-13 ENCOUNTER — Encounter (HOSPITAL_COMMUNITY): Payer: Self-pay | Admitting: *Deleted

## 2015-03-13 ENCOUNTER — Emergency Department (HOSPITAL_COMMUNITY): Payer: BLUE CROSS/BLUE SHIELD

## 2015-03-13 DIAGNOSIS — Z87891 Personal history of nicotine dependence: Secondary | ICD-10-CM | POA: Insufficient documentation

## 2015-03-13 DIAGNOSIS — M25512 Pain in left shoulder: Secondary | ICD-10-CM | POA: Insufficient documentation

## 2015-03-13 DIAGNOSIS — R079 Chest pain, unspecified: Secondary | ICD-10-CM | POA: Diagnosis not present

## 2015-03-13 DIAGNOSIS — M542 Cervicalgia: Secondary | ICD-10-CM | POA: Diagnosis not present

## 2015-03-13 LAB — CBC
HCT: 39.9 % (ref 36.0–46.0)
Hemoglobin: 13.1 g/dL (ref 12.0–15.0)
MCH: 33.2 pg (ref 26.0–34.0)
MCHC: 32.8 g/dL (ref 30.0–36.0)
MCV: 101.3 fL — AB (ref 78.0–100.0)
PLATELETS: 275 10*3/uL (ref 150–400)
RBC: 3.94 MIL/uL (ref 3.87–5.11)
RDW: 12.2 % (ref 11.5–15.5)
WBC: 6.1 10*3/uL (ref 4.0–10.5)

## 2015-03-13 LAB — BASIC METABOLIC PANEL
Anion gap: 12 (ref 5–15)
BUN: 6 mg/dL (ref 6–20)
CALCIUM: 9.8 mg/dL (ref 8.9–10.3)
CHLORIDE: 104 mmol/L (ref 101–111)
CO2: 26 mmol/L (ref 22–32)
CREATININE: 0.7 mg/dL (ref 0.44–1.00)
GFR calc Af Amer: >60 mL/min (ref >60)
GFR calc non Af Amer: >60 mL/min (ref >60)
GLUCOSE: 112 mg/dL — AB (ref 65–99)
Potassium: 4.5 mmol/L (ref 3.5–5.1)
Sodium: 142 mmol/L (ref 135–145)

## 2015-03-13 LAB — I-STAT TROPONIN, ED
Troponin i, poc: 0 ng/mL (ref 0.00–0.08)
Troponin i, poc: 0 ng/mL (ref 0.00–0.08)

## 2015-03-13 LAB — D-DIMER, QUANTITATIVE: D-Dimer, Quant: <0.27 ug/mL-FEU (ref 0.00–0.50)

## 2015-03-13 MED ORDER — HYDROCODONE-ACETAMINOPHEN 5-325 MG PO TABS
1.0000 | ORAL_TABLET | Freq: Once | ORAL | Status: AC
  Administered 2015-03-13: 1 via ORAL
  Filled 2015-03-13: qty 1

## 2015-03-13 MED ORDER — HYDROCODONE-ACETAMINOPHEN 5-325 MG PO TABS: 1.0000 | ORAL_TABLET | Freq: Four times a day (QID) | ORAL | Status: DC | PRN

## 2015-03-13 NOTE — ED Provider Notes (Signed)
CSN: [id number]     Arrival date & time 03/13/15  1228 History   First MD Initiated Contact with Patient 03/13/15 1327     Chief Complaint  Patient presents with  . Neck Pain  . Arm Pain  . Abnormal ECG     Patient is a 60 y.o. female presenting with neck pain and arm pain. The history is provided by the patient. No language interpreter was used.  Neck Pain Arm Pain   Nicole Casey is a 60 y.o. female who presents to the Emergency Department complaining of neck pain.  Last night about 8:30 she developed severe left-sided neck pain that radiated down her left anterior shoulder to her hand, pain was constant in nature. It is almost resolved currently after home medications. She did have some increased tenderness to palpation over her left upper shoulder and chest wall. Pain is worse with deep breaths. She has no shortness of breath, numbness, weakness, fevers, cough. She saw her PCP today and was referred to the ER for further evaluation. 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  Past Medical History  Diagnosis Date  . BRCA gene positive 06/28/2008    did not tolerate Tamoxifen - saw Dr. Lamonte Sakai  . Herniated lumbar disc without myelopathy     L 3-4-5  . PONV (postoperative nausea and vomiting)     developed rash with knee replacement unsure of cause   . Arthritis    Past Surgical History  Procedure Laterality Date  . Cervical spine surgery  1993    hernaited disc repair  . Total knee arthroplasty Right 2007  . Abdominal hysterectomy  2002    fibroids, DUB  . Tubal ligation Bilateral 1987  . Salpingoophorectomy Right 1988    secondary to cyst  . Salpingoophorectomy Left 09/2008    secondary to BRCA +  . Retinal detachment surgery Right 1993  . Joint replacement      right knee  . Foot surgery      let foot  . Colonoscopy    . Esophagogastroduodenoscopy    . Lumbar fusion  10/2012    L3.L4,L5,L6, fusion and repair  . Back surgery     . Eye surgery    . Total hip arthroplasty Right 11/28/2013    Procedure: TOTAL HIP ARTHROPLASTY ANTERIOR APPROACH;  Surgeon: Hessie Dibble, MD;  Location: [facility];  Service: Orthopedics;  Laterality: Right;   Family History  Problem Relation Age of Onset  . Ovarian cancer Paternal Grandmother   . Breast cancer Cousin     paternal  . Ovarian cancer Cousin     paternal  . Cervical cancer Cousin     paternal  . Breast cancer Cousin     paternal  . Leukemia Daughter   . Cancer Brother   . Melanoma Sister    Social History  Substance Use Topics  . Smoking status: Former Smoker -- 0.50 packs/day for 4 years    Types: Cigarettes    Quit date: 02/03/1976  . Smokeless tobacco: Never Used     Comment: for a while in college  . Alcohol Use: 2.4 oz/week    4 Glasses of wine per week   OB History    Gravida Para Term Preterm AB TAB SAB Ectopic Multiple Living   '2 2 2 ' 0 0 0 0 0 0 2     Review of Systems  Musculoskeletal: Positive for neck pain.  All other systems  reviewed and are negative.     Allergies  Review of patient's allergies indicates no known allergies.  Home Medications   Prior to Admission medications   Medication Sig Start Date End Date Taking? Authorizing Provider  acetaminophen (TYLENOL) 500 MG tablet Take 500 mg by mouth every 6 (six) hours as needed for mild pain.   Yes Historical Provider, MD  PRESCRIPTION MEDICATION Take 1 tablet by mouth daily as needed. Husband muscle relaxer. " unknown name"   Yes Historical Provider, MD  HYDROcodone-acetaminophen (NORCO/VICODIN) 5-325 MG tablet Take 1 tablet by mouth every 6 (six) hours as needed. 03/13/15   Quintella Reichert, MD   BP 138/62 mmHg  Pulse 56  Temp(Src) 98.3 F (36.8 C) (Oral)  Resp 18  SpO2 100%  LMP 02/03/2000 Physical Exam  Constitutional: She is oriented to person, place, and time. She appears well-developed and well-nourished.  HENT:  Head: Normocephalic and atraumatic.  Neck: Neck supple. No  thyromegaly present.  Cardiovascular: Normal rate and regular rhythm.   No murmur heard. 2+ radial pulses bilaterally  Pulmonary/Chest: Effort normal and breath sounds normal. No respiratory distress. She exhibits tenderness.  Abdominal: Soft. There is no tenderness. There is no rebound and no guarding.  Musculoskeletal: She exhibits no edema or tenderness.  Lymphadenopathy:    She has no cervical adenopathy.  Neurological: She is alert and oriented to person, place, and time.  Skin: Skin is warm and dry.  Psychiatric: She has a normal mood and affect. Her behavior is normal.  Nursing note and vitals reviewed.   ED Course  Procedures (including critical care time) Labs Review Labs Reviewed  BASIC METABOLIC PANEL - Abnormal; Notable for the following:    Glucose, Bld 112 (*)    All other components within normal limits  CBC - Abnormal; Notable for the following:    MCV 101.3 (*)    All other components within normal limits  D-DIMER, QUANTITATIVE (NOT AT [facility])  Randolm Idol, ED  Randolm Idol, ED    Imaging Review Dg Chest 2 View  03/13/2015  CLINICAL DATA:  Chest pain extending into left arm EXAM: CHEST  2 VIEW COMPARISON:  November 16, 2013 FINDINGS: Lungs are clear. Heart size and pulmonary vascularity are normal. No adenopathy. There is mild upper thoracic levoscoliosis. There is degenerative change in the thoracic spine. No pneumothorax. IMPRESSION: No edema or consolidation. Electronically Signed   By: Lowella Grip III M.D.   On: 03/13/2015 13:00   I have personally reviewed and evaluated these images and lab results as part of my medical decision-making.   EKG Interpretation   Date/Time:  Wednesday March 13 2015 12:41:41 EST Ventricular Rate:  89 PR Interval:  152 QRS Duration: 78 QT Interval:  358 QTC Calculation: 435 R Axis:   63 Text Interpretation:  Normal sinus rhythm Nonspecific ST and T wave  abnormality Abnormal ECG Confirmed by Hazle Coca  [phone]) on 03/13/2015  12:40:37 PM      MDM   Final diagnoses:  Acute neck pain    Pt here for evaluation of left sided neck pain, present but improving on exam.  Hx and presentation is not c/w ACS, dissection.  Pt is low risk for PE, ddimer negative, doubt acute PE.  No evidence of acute infectious process on exam.  D/w pt home care with pain control, ibuprofen as needed with norco for any additional pain.  Discussed outpatient follow up as well as return precautions.      Quintella Reichert,  MD 03/13/15 1712

## 2015-03-13 NOTE — Discharge Instructions (Signed)
Radicular Pain °Radicular pain in either the arm or leg is usually from a bulging or herniated disk in the spine. A piece of the herniated disk may press against the nerves as the nerves exit the spine. This causes pain which is felt at the tips of the nerves down the arm or leg. Other causes of radicular pain may include: °· Fractures. °· Heart disease. °· Cancer. °· An abnormal and usually degenerative state of the nervous system or nerves (neuropathy). °Diagnosis may require CT or MRI scanning to determine the primary cause.  °Nerves that start at the neck (nerve roots) may cause radicular pain in the outer shoulder and arm. It can spread down to the thumb and fingers. The symptoms vary depending on which nerve root has been affected. In most cases radicular pain improves with conservative treatment. Neck problems may require physical therapy, a neck collar, or cervical traction. Treatment may take many weeks, and surgery may be considered if the symptoms do not improve.  °Conservative treatment is also recommended for sciatica. Sciatica causes pain to radiate from the lower back or buttock area down the leg into the foot. Often there is a history of back problems. Most patients with sciatica are better after 2 to 4 weeks of rest and other supportive care. Short term bed rest can reduce the disk pressure considerably. Sitting, however, is not a good position since this increases the pressure on the disk. You should avoid bending, lifting, and all other activities which make the problem worse. Traction can be used in severe cases. Surgery is usually reserved for patients who do not improve within the first months of treatment. °Only take over-the-counter or prescription medicines for pain, discomfort, or fever as directed by your caregiver. Narcotics and muscle relaxants may help by relieving more severe pain and spasm and by providing mild sedation. Cold or massage can give significant relief. Spinal manipulation  is not recommended. It can increase the degree of disc protrusion. Epidural steroid injections are often effective treatment for radicular pain. These injections deliver medicine to the spinal nerve in the space between the protective covering of the spinal cord and back bones (vertebrae). Your caregiver can give you more information about steroid injections. These injections are most effective when given within two weeks of the onset of pain.  °You should see your caregiver for follow up care as recommended. A program for neck and back injury rehabilitation with stretching and strengthening exercises is an important part of management.  °SEEK IMMEDIATE MEDICAL CARE IF: °· You develop increased pain, weakness, or numbness in your arm or leg. °· You develop difficulty with bladder or bowel control. °· You develop abdominal pain. °  °This information is not intended to replace advice given to you by your health care provider. Make sure you discuss any questions you have with your health care provider. °  °Document Released: 02/27/2004 Document Revised: 02/09/2014 Document Reviewed: 08/15/2014 °Elsevier Interactive Patient Education ©2016 [employer] ° °

## 2015-03-13 NOTE — ED Notes (Signed)
Pt reports onset last night of severe left side neck pain that radiates down left arm. Went to pcp office today sent here for abnormal ekg. No acute distress noted.

## 2015-08-16 IMAGING — RF DG HIP OPERATIVE*R*
1 series · 2 of 2 positions shown · non-contrast
Comparison: Two digital C-arm fluoroscopic images obtained
intraoperatively without priors for comparison.

CLINICAL DATA: RIGHT total hip replacement

EXAM:
DG OPERATIVE RIGHT HIP 1-2 VIEWS
TECHNIQUE: Fluoroscopic spot image(s) were submitted for interpretation
post-operatively.

[Series 1: run · 2 of 2 slices shown]
[im 1/2]
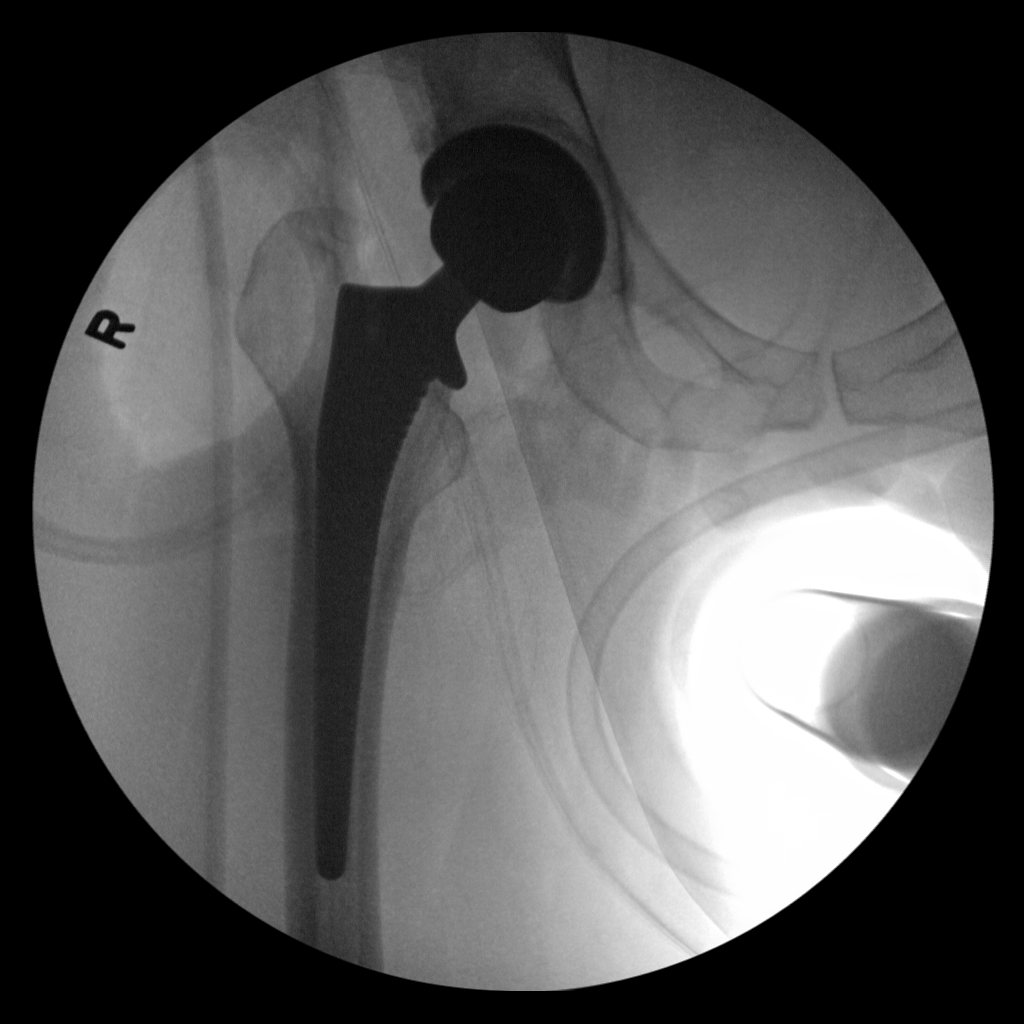
[im 2/2]
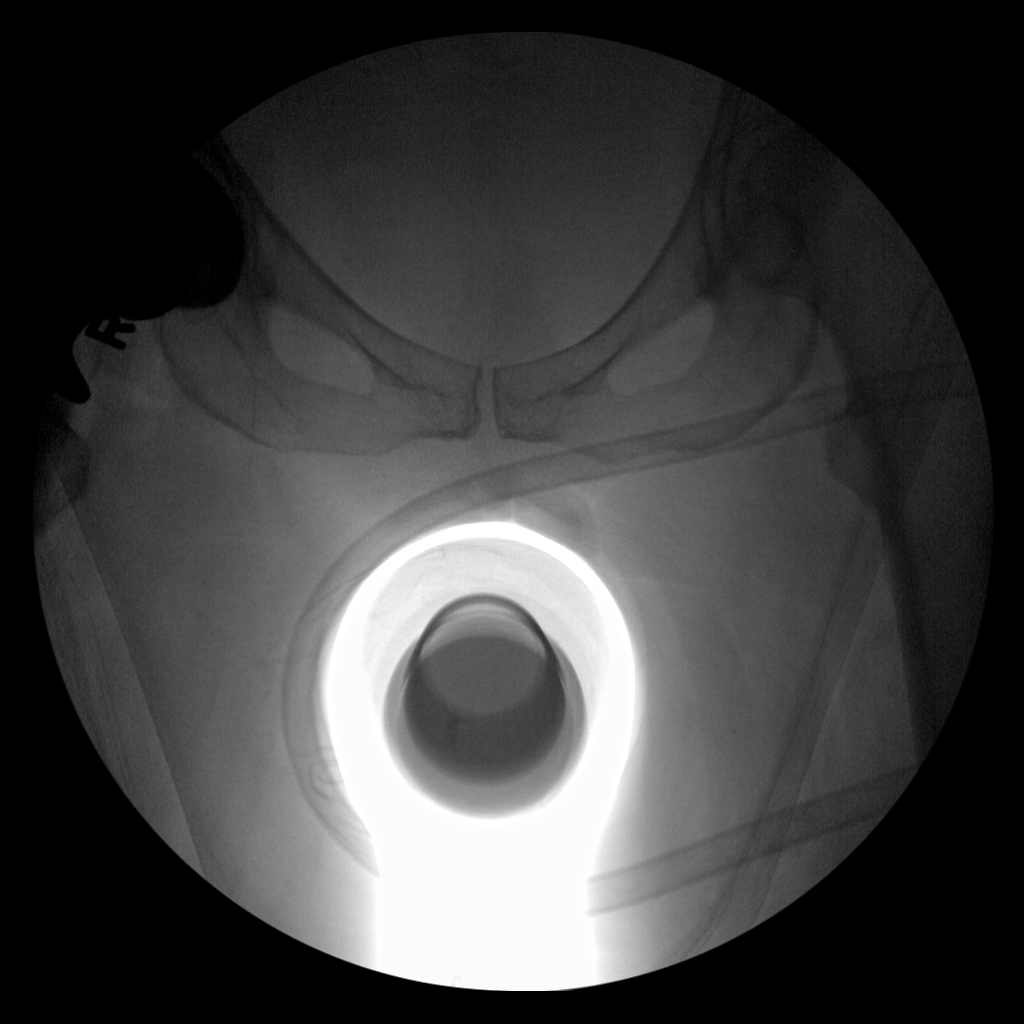

[2 of 2 positions shown; findings below may reference images not displayed]

FINDINGS: Components of RIGHT hip prosthesis in expected positions.

No acute fracture dislocation.

Bones appear demineralized.
IMPRESSION: RIGHT hip prosthesis without acute complication.

## 2015-09-06 ENCOUNTER — Other Ambulatory Visit: Payer: Self-pay | Admitting: Obstetrics & Gynecology

## 2015-09-06 DIAGNOSIS — Z1231 Encounter for screening mammogram for malignant neoplasm of breast: Secondary | ICD-10-CM

## 2015-09-20 ENCOUNTER — Ambulatory Visit: Payer: BLUE CROSS/BLUE SHIELD

## 2015-10-17 ENCOUNTER — Ambulatory Visit
Admission: RE | Admit: 2015-10-17 | Discharge: 2015-10-17 | Disposition: A | Discharge status: Home / Self Care | Payer: BLUE CROSS/BLUE SHIELD | Source: Ambulatory Visit | Attending: Obstetrics & Gynecology | Admitting: Obstetrics & Gynecology

## 2015-10-17 DIAGNOSIS — Z1231 Encounter for screening mammogram for malignant neoplasm of breast: Secondary | ICD-10-CM

## 2015-11-03 HISTORY — PX: MELANOMA EXCISION: SHX5266

## 2015-12-30 DIAGNOSIS — L814 Other melanin hyperpigmentation: Secondary | ICD-10-CM | POA: Diagnosis not present

## 2015-12-30 DIAGNOSIS — L570 Actinic keratosis: Secondary | ICD-10-CM | POA: Diagnosis not present

## 2015-12-30 DIAGNOSIS — Z85820 Personal history of malignant melanoma of skin: Secondary | ICD-10-CM | POA: Diagnosis not present

## 2015-12-30 DIAGNOSIS — D2262 Melanocytic nevi of left upper limb, including shoulder: Secondary | ICD-10-CM | POA: Diagnosis not present

## 2015-12-30 DIAGNOSIS — L821 Other seborrheic keratosis: Secondary | ICD-10-CM | POA: Diagnosis not present

## 2015-12-31 ENCOUNTER — Encounter: Payer: Self-pay | Admitting: *Deleted

## 2015-12-31 ENCOUNTER — Ambulatory Visit (INDEPENDENT_AMBULATORY_CARE_PROVIDER_SITE_OTHER): Payer: BLUE CROSS/BLUE SHIELD | Admitting: Nurse Practitioner

## 2015-12-31 ENCOUNTER — Encounter: Payer: Self-pay | Admitting: Nurse Practitioner

## 2015-12-31 VITALS — BP 108/74 | HR 68 | Ht 65.75 in | Wt 176.0 lb

## 2015-12-31 DIAGNOSIS — Z1502 Genetic susceptibility to malignant neoplasm of ovary: Secondary | ICD-10-CM

## 2015-12-31 DIAGNOSIS — Z0000 Encounter for general adult medical examination without abnormal findings: Secondary | ICD-10-CM | POA: Diagnosis not present

## 2015-12-31 DIAGNOSIS — Z1501 Genetic susceptibility to malignant neoplasm of breast: Secondary | ICD-10-CM | POA: Diagnosis not present

## 2015-12-31 DIAGNOSIS — Z01419 Encounter for gynecological examination (general) (routine) without abnormal findings: Secondary | ICD-10-CM | POA: Diagnosis not present

## 2015-12-31 DIAGNOSIS — Z800 Family history of malignant neoplasm of digestive organs: Secondary | ICD-10-CM

## 2015-12-31 DIAGNOSIS — Z1509 Genetic susceptibility to other malignant neoplasm: Secondary | ICD-10-CM

## 2015-12-31 DIAGNOSIS — Z803 Family history of malignant neoplasm of breast: Secondary | ICD-10-CM

## 2015-12-31 LAB — POCT URINALYSIS DIPSTICK
BILIRUBIN UA: NEGATIVE
Glucose, UA: NEGATIVE
KETONES UA: NEGATIVE
LEUKOCYTES UA: NEGATIVE
NITRITE UA: NEGATIVE
PH UA: 7
PROTEIN UA: NEGATIVE
RBC UA: NEGATIVE
Urobilinogen, UA: NEGATIVE

## 2015-12-31 NOTE — Progress Notes (Signed)
Patient ID: Nicole Casey, female   DOB: May 11, 1955, 60 y.o.   MRN: [id number]  60 y.o. G71P2002 Married  Caucasian Fe here for annual exam.  No new problems this year other than pain in her right neck.  She was seen in ED and was felt to be from a muscle spasm.  She also had a Left arm Melanoma in situ removed a few months ago. Mother passed October 15, 2015 oldest brother died 2023/02/05 in his sleep, maybe from emboli.  Other sister age 71 with new diagnosis of breast cancer stage 1 and BRCA negative. Husband diagnosed with Melanoma on his back with lymph node involvement, he is on chemo.  Lung lesion that is suspicious.  Pt is BRCA positive and unable to take Tamoxifen.  Patient's last menstrual period was 02/03/2000.          Sexually active: Yes.    The current method of family planning is status post hysterectomy.    Exercising: Yes.    cardio and weights Smoker:  no  Health Maintenance: Pap: 12/21/14, Negative with neg HR HPV  MMG: 10/17/15, BI-Rads 1: Negative  Colonoscopy: 10/23/09 normal repeat in 10 years  BMD: 08/10/08 T Score 1.0 Spine / -0.5 Left Femur Neck TDaP: 09/19/10 Shingles: Never - will discuss with pharmacist. Pneumonia: Not indicated due to age Hep C and HIV:  discuss today and will get at PCP or here next year Labs: declined this year  Urine: negative    reports that she quit smoking about 39 years ago. Her smoking use included Cigarettes. She has a 2.00 pack-year smoking history. She has never used smokeless tobacco. She reports that she drinks about 2.4 oz of alcohol per week . She reports that she does not use drugs.  Past Medical History:  Diagnosis Date  . Arthritis   . BRCA gene positive 06/28/2008   did not tolerate Tamoxifen - saw Dr. Lamonte Sakai  . Herniated lumbar disc without myelopathy    L 3-4-5  . 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 PONV (postoperative nausea and vomiting)    developed rash with knee replacement unsure of cause     Past Surgical  History:  Procedure Laterality Date  . ABDOMINAL HYSTERECTOMY  2002   fibroids, DUB  . BACK SURGERY    . [place]   hernaited disc repair  . COLONOSCOPY    . ESOPHAGOGASTRODUODENOSCOPY    . EYE SURGERY    . FOOT SURGERY     let foot  . JOINT REPLACEMENT     right knee  . LUMBAR FUSION  10/2012   L3.L4,L5,L6, fusion and repair  . MELANOMA EXCISION Left 2017  . RETINAL DETACHMENT SURGERY Right 1993  . SALPINGOOPHORECTOMY Right 1988   secondary to cyst  . SALPINGOOPHORECTOMY Left 09/2008   secondary to BRCA +  . TOTAL HIP ARTHROPLASTY Right 11/28/2013   Procedure: TOTAL HIP ARTHROPLASTY ANTERIOR APPROACH;  Surgeon: Hessie Dibble, MD;  Location: [facility];  Service: Orthopedics;  Laterality: Right;  . TOTAL KNEE ARTHROPLASTY Right 2007  . TUBAL LIGATION Bilateral 1987    No current outpatient prescriptions on file.   No current facility-administered medications for this visit.     Family History  Problem Relation Age of Onset  . Ovarian cancer Paternal Grandmother   . Breast cancer Cousin     paternal  . Ovarian cancer Cousin     paternal  . Breast cancer Cousin  paternal  . Stroke Mother   . Heart failure Mother   . Colon cancer Brother 73  . Melanoma Sister   . Breast cancer Sister 11    BRCA negative  . Cervical cancer Cousin     paternal  . Leukemia Daughter     ROS:  Pertinent items are noted in HPI.  Otherwise, a comprehensive ROS was negative.  Exam:   BP 108/74 (BP Location: Right Arm, Patient Position: Sitting, Cuff Size: Large)   Pulse 68   Ht 5' 5.75" (1.67 m)   Wt 176 lb (79.8 kg)   LMP 02/03/2000   BMI 28.62 kg/m  Height: 5' 5.75" (167 cm) Ht Readings from Last 3 Encounters:  12/31/15 5' 5.75" (1.67 m)  12/21/14 5' 5.75" (1.67 m)  11/16/13 5' 6.5" (1.689 m)    General appearance: alert, cooperative and appears stated age Head: Normocephalic, without obvious abnormality, atraumatic Neck: no adenopathy, supple,  symmetrical, trachea midline and thyroid normal to inspection and palpation Lungs: clear to auscultation bilaterally Breasts: normal appearance, no masses or tenderness Heart: regular rate and rhythm Abdomen: soft, non-tender; no masses,  no organomegaly Extremities: extremities normal, atraumatic, no cyanosis or edema Skin: Skin color, texture, turgor normal. No rashes or lesions Lymph nodes: Cervical, supraclavicular, and axillary nodes normal. No abnormal inguinal nodes palpated Neurologic: Grossly normal   Pelvic: External genitalia:  no lesions              Urethra:  normal appearing urethra with no masses, tenderness or lesions              Bartholin's and Skene's: normal                 Vagina: normal appearing vagina with normal color and discharge, no lesions              Cervix: absent              Pap taken: No. Bimanual Exam:  Uterus:  uterus absent              Adnexa: no mass, fullness, tenderness               Rectovaginal: Confirms               Anus:  normal sphincter tone, no lesions  Chaperone present: yes  A:  Well Woman with normal exam  Postmenopausal no HRT Strong [place] of OV, Breast cancer, cervical cancer, omentum cancer, colon cancer S/P TAH 2002 secondary to fibroids and DUB  S/P BSO 1988 secondary to cyst & 09/2008 secondary to BRCA I + gene Patient with BRCA I + = unable to tolerate Tamoxifen 2/12 (saw Dr. 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

## 2015-12-31 NOTE — Patient Instructions (Signed)

## 2016-01-02 ENCOUNTER — Encounter: Payer: Self-pay | Admitting: Obstetrics & Gynecology

## 2016-01-02 NOTE — Progress Notes (Signed)
Reviewed personally.  M. Suzanne Jhayla Podgorski, MD.  

## 2016-01-07 ENCOUNTER — Encounter: Payer: Self-pay | Admitting: Nurse Practitioner

## 2016-01-07 ENCOUNTER — Telehealth: Payer: Self-pay | Admitting: Nurse Practitioner

## 2016-01-07 DIAGNOSIS — Z1509 Genetic susceptibility to other malignant neoplasm: Principal | ICD-10-CM

## 2016-01-07 DIAGNOSIS — Z1501 Genetic susceptibility to malignant neoplasm of breast: Secondary | ICD-10-CM

## 2016-01-07 NOTE — Telephone Encounter (Signed)
PA completed with Trula Orehristina from [employer]. Authorization for bilateral breast MRI has been denied. Requires peer to peer review before 01/09/2016 at 7 pm. Please contact [employer] at [phone] to complete.  Cc: Harland DingwallSuzy Dixon

## 2016-01-07 NOTE — Telephone Encounter (Signed)
Pt is BRCA I - gene positive.  She needs to have a bilateral breast MRI.  The Mammogram was done 10/21/15 and has to be done within 3 months of last Mammo.  Will place the order and pt was informed.

## 2016-01-07 NOTE — Telephone Encounter (Signed)
PA has been authorized and number given to triage nurse.

## 2016-01-09 NOTE — Telephone Encounter (Signed)
Patient is scheduled for MRI [facility] on 01/02/13/17.   Routing to Walt DisneyPatricia Grubb

## 2016-01-10 NOTE — Telephone Encounter (Signed)
OK to close

## 2016-01-13 ENCOUNTER — Encounter: Payer: Self-pay | Admitting: Nurse Practitioner

## 2016-01-13 NOTE — Telephone Encounter (Signed)
Patient placed in imaging hold.  

## 2016-01-15 ENCOUNTER — Ambulatory Visit
Admission: RE | Admit: 2016-01-15 | Discharge: 2016-01-15 | Disposition: A | Discharge status: Home / Self Care | Payer: BLUE CROSS/BLUE SHIELD | Source: Ambulatory Visit | Attending: Nurse Practitioner | Admitting: Nurse Practitioner

## 2016-01-15 DIAGNOSIS — Z1509 Genetic susceptibility to other malignant neoplasm: Principal | ICD-10-CM

## 2016-01-15 DIAGNOSIS — N6489 Other specified disorders of breast: Secondary | ICD-10-CM | POA: Diagnosis not present

## 2016-01-15 DIAGNOSIS — Z1501 Genetic susceptibility to malignant neoplasm of breast: Secondary | ICD-10-CM

## 2016-01-15 MED ORDER — GADOBENATE DIMEGLUMINE 529 MG/ML IV SOLN
15.0000 mL | Freq: Once | INTRAVENOUS | Status: AC | PRN
  Administered 2016-01-15: 15 mL via INTRAVENOUS

## 2016-01-16 ENCOUNTER — Telehealth: Payer: Self-pay

## 2016-01-16 ENCOUNTER — Other Ambulatory Visit: Payer: BLUE CROSS/BLUE SHIELD

## 2016-01-16 NOTE — Telephone Encounter (Signed)
Left message to call Kaitlyn at [phone]. 

## 2016-01-16 NOTE — Telephone Encounter (Signed)
-----   Message from Jerene BearsMary S Miller, MD sent at 01/16/2016 12:57 PM EST ----- Please inform pt that her MRI was negative.  Yearly MRI recommended.  Ok to remove from imaging hold.   CC: Shirlyn GoltzPatty Grubb

## 2016-01-20 NOTE — Telephone Encounter (Signed)
Spoke with patient. Advised of message and results as seen below form Dr.Miller. Patient is agreeable and verbalizes understanding. Removed from imaging hold.  Routing to provider for final review. Patient agreeable to disposition. Will close encounter.

## 2016-01-20 NOTE — Telephone Encounter (Signed)
Left message to call Kaitlyn at [phone]. 

## 2016-03-20 DIAGNOSIS — M1811 Unilateral primary osteoarthritis of first carpometacarpal joint, right hand: Secondary | ICD-10-CM | POA: Diagnosis not present

## 2016-07-06 DIAGNOSIS — B078 Other viral warts: Secondary | ICD-10-CM | POA: Diagnosis not present

## 2016-07-06 DIAGNOSIS — L821 Other seborrheic keratosis: Secondary | ICD-10-CM | POA: Diagnosis not present

## 2016-07-06 DIAGNOSIS — D225 Melanocytic nevi of trunk: Secondary | ICD-10-CM | POA: Diagnosis not present

## 2016-07-06 DIAGNOSIS — L570 Actinic keratosis: Secondary | ICD-10-CM | POA: Diagnosis not present

## 2016-07-06 DIAGNOSIS — Z85820 Personal history of malignant melanoma of skin: Secondary | ICD-10-CM | POA: Diagnosis not present

## 2016-07-06 DIAGNOSIS — L814 Other melanin hyperpigmentation: Secondary | ICD-10-CM | POA: Diagnosis not present

## 2016-07-14 DIAGNOSIS — H35371 Puckering of macula, right eye: Secondary | ICD-10-CM | POA: Diagnosis not present

## 2016-07-14 DIAGNOSIS — H25013 Cortical age-related cataract, bilateral: Secondary | ICD-10-CM | POA: Diagnosis not present

## 2016-07-14 DIAGNOSIS — H2513 Age-related nuclear cataract, bilateral: Secondary | ICD-10-CM | POA: Diagnosis not present

## 2016-07-14 DIAGNOSIS — H25041 Posterior subcapsular polar age-related cataract, right eye: Secondary | ICD-10-CM | POA: Diagnosis not present

## 2016-07-14 DIAGNOSIS — H04123 Dry eye syndrome of bilateral lacrimal glands: Secondary | ICD-10-CM | POA: Diagnosis not present

## 2016-08-25 ENCOUNTER — Telehealth: Payer: Self-pay | Admitting: Obstetrics & Gynecology

## 2016-08-25 NOTE — Telephone Encounter (Signed)
LMTCB/:NP/ .CX/LETTER SENT/RD ° °

## 2016-09-15 DIAGNOSIS — H6121 Impacted cerumen, right ear: Secondary | ICD-10-CM | POA: Diagnosis not present

## 2016-09-15 DIAGNOSIS — H6991 Unspecified Eustachian tube disorder, right ear: Secondary | ICD-10-CM | POA: Diagnosis not present

## 2016-09-15 DIAGNOSIS — H6980 Other specified disorders of Eustachian tube, unspecified ear: Secondary | ICD-10-CM | POA: Diagnosis not present

## 2016-10-01 ENCOUNTER — Other Ambulatory Visit: Payer: Self-pay | Admitting: Obstetrics & Gynecology

## 2016-10-01 DIAGNOSIS — Z1231 Encounter for screening mammogram for malignant neoplasm of breast: Secondary | ICD-10-CM

## 2016-10-16 ENCOUNTER — Ambulatory Visit (HOSPITAL_COMMUNITY)
Admission: RE | Admit: 2016-10-16 | Discharge: 2016-10-16 | Disposition: A | Discharge status: Home / Self Care | Payer: PRIVATE HEALTH INSURANCE | Source: Ambulatory Visit | Attending: *Deleted | Admitting: *Deleted

## 2016-10-16 ENCOUNTER — Other Ambulatory Visit: Payer: Self-pay

## 2016-10-16 ENCOUNTER — Other Ambulatory Visit (HOSPITAL_COMMUNITY): Payer: Self-pay | Admitting: Transplant Surgery

## 2016-10-16 ENCOUNTER — Ambulatory Visit (HOSPITAL_COMMUNITY)
Admission: RE | Admit: 2016-10-16 | Discharge: 2016-10-16 | Disposition: A | Discharge status: Home / Self Care | Payer: PRIVATE HEALTH INSURANCE | Source: Ambulatory Visit | Attending: Transplant Surgery | Admitting: Transplant Surgery

## 2016-10-16 ENCOUNTER — Other Ambulatory Visit (HOSPITAL_COMMUNITY): Payer: Self-pay | Admitting: *Deleted

## 2016-10-16 DIAGNOSIS — Z01818 Encounter for other preprocedural examination: Secondary | ICD-10-CM | POA: Diagnosis not present

## 2016-10-16 DIAGNOSIS — Z524 Kidney donor: Secondary | ICD-10-CM

## 2016-10-16 DIAGNOSIS — Z005 Encounter for examination of potential donor of organ and tissue: Secondary | ICD-10-CM | POA: Diagnosis not present

## 2016-10-19 ENCOUNTER — Ambulatory Visit
Admission: RE | Admit: 2016-10-19 | Discharge: 2016-10-19 | Disposition: A | Discharge status: Home / Self Care | Payer: BLUE CROSS/BLUE SHIELD | Source: Ambulatory Visit | Attending: Obstetrics & Gynecology | Admitting: Obstetrics & Gynecology

## 2016-10-19 DIAGNOSIS — Z1231 Encounter for screening mammogram for malignant neoplasm of breast: Secondary | ICD-10-CM

## 2016-10-20 ENCOUNTER — Telehealth (HOSPITAL_COMMUNITY): Payer: Self-pay | Admitting: Transplant Surgery

## 2016-10-21 ENCOUNTER — Ambulatory Visit (HOSPITAL_COMMUNITY)
Admission: RE | Admit: 2016-10-21 | Discharge: 2016-10-21 | Disposition: A | Discharge status: Home / Self Care | Payer: PRIVATE HEALTH INSURANCE | Source: Ambulatory Visit | Attending: Transplant Surgery | Admitting: Transplant Surgery

## 2016-10-21 DIAGNOSIS — Z005 Encounter for examination of potential donor of organ and tissue: Secondary | ICD-10-CM

## 2016-10-22 ENCOUNTER — Telehealth (HOSPITAL_COMMUNITY): Payer: Self-pay | Admitting: *Deleted

## 2016-10-22 NOTE — Telephone Encounter (Signed)
Patient given detailed instructions per Stress Test Requisition Sheet for test on 10/26/16 at 2:30.Patient Notified to arrive 30 minutes early, and that it is imperative to arrive on time for appointment to keep from having the test rescheduled.  Patient verbalized understanding. Daneil Dolin

## 2016-10-23 ENCOUNTER — Other Ambulatory Visit (HOSPITAL_COMMUNITY): Payer: BLUE CROSS/BLUE SHIELD

## 2016-10-26 ENCOUNTER — Ambulatory Visit (HOSPITAL_COMMUNITY): Payer: Medicare (Managed Care) | Attending: Cardiology

## 2016-10-26 ENCOUNTER — Inpatient Hospital Stay (HOSPITAL_COMMUNITY): Admission: RE | Admit: 2016-10-26 | Payer: BLUE CROSS/BLUE SHIELD | Source: Ambulatory Visit

## 2016-10-26 ENCOUNTER — Ambulatory Visit (HOSPITAL_BASED_OUTPATIENT_CLINIC_OR_DEPARTMENT_OTHER): Payer: Medicare (Managed Care)

## 2016-10-26 ENCOUNTER — Other Ambulatory Visit: Payer: Self-pay

## 2016-10-26 ENCOUNTER — Ambulatory Visit (HOSPITAL_COMMUNITY): Payer: Medicare (Managed Care)

## 2016-10-26 DIAGNOSIS — Z005 Encounter for examination of potential donor of organ and tissue: Secondary | ICD-10-CM | POA: Diagnosis present

## 2016-10-26 LAB — ECHOCARDIOGRAM COMPLETE
AOASC: 34 cm
AVLVOTPG: 5 mmHg
AVPHT: 467 ms
CHL CUP DOP CALC LVOT VTI: 26.5 cm
EERAT: 6.58
EWDT: 243 ms
FS: 36 % (ref 28–44)
IV/PV OW: 0.9
LA diam end sys: 36 mm
LA vol A4C: 62 ml
LA vol index: 35 mL/m2
LA vol: 68 mL
LADIAMINDEX: 1.85 cm/m2
LASIZE: 36 mm
LV E/e' medial: 6.58
LV TDI E'LATERAL: 9.46
LV e' LATERAL: 9.46 cm/s
LVDIAVOL: 102 mL (ref 46–106)
LVDIAVOLIN: 52 mL/m2
LVEEAVG: 6.58
LVOT SV: 83 mL
LVOT area: 3.14 cm2
LVOT diameter: 20 mm
LVOT peak vel: 109 cm/s
MV Dec: 243
MVPKAVEL: 44.9 m/s
MVPKEVEL: 62.2 m/s
PW: 10.9 mm — AB (ref 0.6–1.1)
RV LATERAL S' VELOCITY: 12.9 cm/s
RV sys press: 24 mmHg
Reg peak vel: 228 cm/s
TDI e' medial: 7.21
TR max vel: 228 cm/s

## 2016-10-26 NOTE — Telephone Encounter (Signed)
User: Nicole Casey A Date/time: 10/20/16 1:51 PM  Comment: Called pt and lmsg for her to CB to get r/s for stress echo.   Context:  Outcome: Left Message  Phone number: [phone] Phone Type: Mobile  Comm. type: Telephone Call type: Outgoing  Contact: Lilian Kapur L Relation to patient: Self

## 2016-10-27 ENCOUNTER — Ambulatory Visit (HOSPITAL_COMMUNITY): Payer: Medicare (Managed Care)

## 2016-10-28 ENCOUNTER — Ambulatory Visit (HOSPITAL_COMMUNITY)
Admission: RE | Admit: 2016-10-28 | Discharge: 2016-10-28 | Disposition: A | Discharge status: Home / Self Care | Payer: PRIVATE HEALTH INSURANCE | Source: Ambulatory Visit | Attending: Transplant Surgery | Admitting: Transplant Surgery

## 2016-10-28 DIAGNOSIS — Z005 Encounter for examination of potential donor of organ and tissue: Secondary | ICD-10-CM | POA: Diagnosis not present

## 2016-11-26 ENCOUNTER — Other Ambulatory Visit (HOSPITAL_COMMUNITY): Payer: BLUE CROSS/BLUE SHIELD

## 2016-11-30 ENCOUNTER — Encounter: Payer: Self-pay | Admitting: Obstetrics & Gynecology

## 2016-11-30 ENCOUNTER — Ambulatory Visit (INDEPENDENT_AMBULATORY_CARE_PROVIDER_SITE_OTHER): Payer: PRIVATE HEALTH INSURANCE | Admitting: Obstetrics & Gynecology

## 2016-11-30 VITALS — BP 136/78 | HR 78 | Resp 14 | Ht 65.5 in | Wt 179.0 lb

## 2016-11-30 DIAGNOSIS — Z01419 Encounter for gynecological examination (general) (routine) without abnormal findings: Secondary | ICD-10-CM
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
## 2016-12-02 ENCOUNTER — Other Ambulatory Visit (HOSPITAL_COMMUNITY): Payer: Self-pay | Admitting: Transplant Surgery

## 2016-12-02 DIAGNOSIS — Z005 Encounter for examination of potential donor of organ and tissue: Secondary | ICD-10-CM

## 2016-12-08 DIAGNOSIS — L570 Actinic keratosis: Secondary | ICD-10-CM | POA: Diagnosis not present

## 2016-12-09 ENCOUNTER — Ambulatory Visit (HOSPITAL_COMMUNITY)
Admission: RE | Admit: 2016-12-09 | Discharge: 2016-12-09 | Disposition: A | Discharge status: Home / Self Care | Payer: PRIVATE HEALTH INSURANCE | Source: Ambulatory Visit | Attending: Transplant Surgery | Admitting: Transplant Surgery

## 2016-12-09 DIAGNOSIS — Z005 Encounter for examination of potential donor of organ and tissue: Secondary | ICD-10-CM

## 2016-12-09 DIAGNOSIS — K769 Liver disease, unspecified: Secondary | ICD-10-CM | POA: Insufficient documentation

## 2016-12-09 LAB — POCT I-STAT CREATININE: CREATININE: 0.7 mg/dL (ref 0.44–1.00)

## 2016-12-09 MED ORDER — IOPAMIDOL (ISOVUE-370) INJECTION 76%
INTRAVENOUS | Status: AC
  Administered 2016-12-09: 100 mL
  Filled 2016-12-09: qty 100

## 2016-12-14 ENCOUNTER — Other Ambulatory Visit (HOSPITAL_COMMUNITY): Payer: Self-pay | Admitting: Transplant Surgery

## 2016-12-14 DIAGNOSIS — Z005 Encounter for examination of potential donor of organ and tissue: Secondary | ICD-10-CM

## 2016-12-21 ENCOUNTER — Ambulatory Visit (HOSPITAL_COMMUNITY): Admission: RE | Admit: 2016-12-21 | Payer: BLUE CROSS/BLUE SHIELD | Source: Ambulatory Visit

## 2016-12-23 ENCOUNTER — Ambulatory Visit (HOSPITAL_COMMUNITY)
Admission: RE | Admit: 2016-12-23 | Discharge: 2016-12-23 | Disposition: A | Discharge status: Home / Self Care | Payer: PRIVATE HEALTH INSURANCE | Source: Ambulatory Visit | Attending: Transplant Surgery | Admitting: Transplant Surgery

## 2016-12-23 DIAGNOSIS — K7689 Other specified diseases of liver: Secondary | ICD-10-CM | POA: Insufficient documentation

## 2016-12-23 DIAGNOSIS — Z005 Encounter for examination of potential donor of organ and tissue: Secondary | ICD-10-CM

## 2017-01-01 DIAGNOSIS — M1811 Unilateral primary osteoarthritis of first carpometacarpal joint, right hand: Secondary | ICD-10-CM | POA: Diagnosis not present

## 2017-01-05 ENCOUNTER — Ambulatory Visit: Payer: BLUE CROSS/BLUE SHIELD | Admitting: Obstetrics & Gynecology

## 2017-01-05 ENCOUNTER — Ambulatory Visit: Payer: BLUE CROSS/BLUE SHIELD | Admitting: Nurse Practitioner

## 2017-02-03 DIAGNOSIS — Z524 Kidney donor: Secondary | ICD-10-CM | POA: Insufficient documentation

## 2017-02-23 DIAGNOSIS — Z905 Acquired absence of kidney: Secondary | ICD-10-CM

## 2017-02-23 HISTORY — DX: Acquired absence of kidney: Z90.5

## 2017-04-07 ENCOUNTER — Telehealth: Payer: Self-pay

## 2017-04-07 DIAGNOSIS — Z1501 Genetic susceptibility to malignant neoplasm of breast: Secondary | ICD-10-CM

## 2017-04-07 DIAGNOSIS — Z1231 Encounter for screening mammogram for malignant neoplasm of breast: Secondary | ICD-10-CM

## 2017-04-07 DIAGNOSIS — Z1509 Genetic susceptibility to other malignant neoplasm: Principal | ICD-10-CM

## 2017-04-07 NOTE — Telephone Encounter (Signed)
Yvonna AlanisKaitlyn and I have spoken with [facility] and obtained approval for the recommended MRI.  Approval number is [id number], this approval is valid 04/07/17 through 05/06/17. I have contacted [facility], spoke with Elease HashimotoPatricia. I have provided authorization information, as well as noting will need to schedule MRI before 04/18/17. Elease Hashimotoatricia advises, they will contact the patient for scheduling.   cc: Dr Hyacinth MeekerMiller  cc: Nolen MuKaitlyn Sprague, RN

## 2017-04-07 NOTE — Telephone Encounter (Signed)
Order for bilateral breat MRI w wo contrast ordered to [facility] for precert before scheduling. Routing to Prax[employer]Suzy Dixon.

## 2017-04-07 NOTE — Telephone Encounter (Signed)
-----  Message from Megan Salon, MD sent at 04/07/2017  6:47 AM EST ----- Regarding: breast MRI Pt needs breast MRI scheduled due to BRCA + status.  MMG was 10/19/16 so will need to be scheduled before 04/18/17.  Thanks.  CC:  Suzy Diona Browner

## 2017-04-12 ENCOUNTER — Ambulatory Visit: Payer: BLUE CROSS/BLUE SHIELD | Admitting: Podiatry

## 2017-04-12 ENCOUNTER — Other Ambulatory Visit: Payer: Self-pay | Admitting: Podiatry

## 2017-04-12 ENCOUNTER — Ambulatory Visit (INDEPENDENT_AMBULATORY_CARE_PROVIDER_SITE_OTHER): Payer: PRIVATE HEALTH INSURANCE

## 2017-04-12 ENCOUNTER — Encounter: Payer: Self-pay | Admitting: Podiatry

## 2017-04-12 DIAGNOSIS — M722 Plantar fascial fibromatosis: Secondary | ICD-10-CM

## 2017-04-12 DIAGNOSIS — M79672 Pain in left foot: Secondary | ICD-10-CM

## 2017-04-12 MED ORDER — TRIAMCINOLONE ACETONIDE 10 MG/ML IJ SUSP
10.0000 mg | Freq: Once | INTRAMUSCULAR | Status: AC
  Administered 2017-04-12: 10 mg

## 2017-04-12 NOTE — Patient Instructions (Signed)

## 2017-04-12 NOTE — Progress Notes (Signed)
Subjective:   Patient ID: Nicole Casey, female   DOB: 62 y.o.   MRN: [id number]   HPI Patient presents stating she has a lot of pain in her mid arch area left and extending into the digits with shooting pains in the digits which are not as intense as the arch.  States is been occurring for the last approximate 2 weeks and she does not remember injury but did get a new pair of shoes over the holiday   ROS      Objective:  Physical Exam  Neurovascular status intact with inflammation of the arch left with inflammation noted of the distal tissue of the fascia and into the mid arch area and also mild discomfort in the metatarsal phalangeal joints with no areas of acute inflammation or swelling.  Previous surgery noted on the left foot     Assessment:  Probability for fasciitis-like condition with compensatory irritation versus possibility for neuroma or other soft tissue pathology     Plan:  H&P condition reviewed and careful injection of the fascia left administered 3 mg Kenalog 5 mg Xylocaine and instructed on physical therapy anti-inflammatories and reduced activity.  Dispense fascial brace with instructions on usage  X-rays indicate that there is no signs of stress fracture or other bony pathology

## 2017-04-26 ENCOUNTER — Encounter: Payer: Self-pay | Admitting: Podiatry

## 2017-04-26 ENCOUNTER — Ambulatory Visit (INDEPENDENT_AMBULATORY_CARE_PROVIDER_SITE_OTHER): Payer: BLUE CROSS/BLUE SHIELD | Admitting: Podiatry

## 2017-04-26 DIAGNOSIS — M779 Enthesopathy, unspecified: Secondary | ICD-10-CM

## 2017-04-26 DIAGNOSIS — M722 Plantar fascial fibromatosis: Secondary | ICD-10-CM

## 2017-04-26 MED ORDER — TRIAMCINOLONE ACETONIDE 10 MG/ML IJ SUSP
10.0000 mg | Freq: Once | INTRAMUSCULAR | Status: AC
  Administered 2017-04-26: 10 mg

## 2017-04-26 NOTE — Progress Notes (Signed)
Subjective:   Patient ID: Nicole Casey, female   DOB: 62 y.o.   MRN: [id number]   HPI Patient presents stating this joint is really bothering me in my forefoot and the arch while improved is still sore and it was better for a few days but seems to be about back to where it was   ROS      Objective:  Physical Exam  Neurovascular status intact with patient's left third MPJ being painful when palpated and also noted to have mild discomfort in the left arch which is not as significant as it was at last visit with diminished discomfort upon deep palpation     Assessment:  Inflammatory capsulitis third MPJ left foot with fasciitis-like symptoms left     Plan:  We will focus on the capsule at this time and I did a forefoot block I then aspirated the joint getting out a small amount of clear fluid and injected quarter cc dexamethasone Kenalog instructed on continued fascial usage ice therapy and reappoint in 3 weeks and also utilize rigid bottom shoes

## 2017-05-07 ENCOUNTER — Telehealth: Payer: Self-pay | Admitting: Obstetrics & Gynecology

## 2017-05-07 NOTE — Telephone Encounter (Signed)
Authorization has been obtained by [employer] for CPT code [id number].The authorization number is [id number]. Approval is valid 05/07/17 through 06/05/17. Spoke with Melissa B.@ 2:13 pm at [facility] and provided the approval number and the dates in which approval is valid.

## 2017-05-07 NOTE — Telephone Encounter (Signed)
Patient scheduled Tuesday, 05/11/17, for an MRI of the breast. She said the authorization expired yesterday and they'll need a new one before the patient is seen.  Cc: Rosa/Suzy

## 2017-05-07 NOTE — Telephone Encounter (Signed)
Clinical information provided to [facility] during authorization call.   Routing to provider for final review. Encounter closed.

## 2017-05-08 ENCOUNTER — Other Ambulatory Visit: Payer: Medicare (Managed Care)

## 2017-05-11 ENCOUNTER — Other Ambulatory Visit: Payer: BLUE CROSS/BLUE SHIELD

## 2017-05-17 ENCOUNTER — Ambulatory Visit: Payer: BLUE CROSS/BLUE SHIELD | Admitting: Podiatry

## 2017-07-16 DIAGNOSIS — H25013 Cortical age-related cataract, bilateral: Secondary | ICD-10-CM | POA: Diagnosis not present

## 2017-07-16 DIAGNOSIS — H04123 Dry eye syndrome of bilateral lacrimal glands: Secondary | ICD-10-CM | POA: Diagnosis not present

## 2017-07-16 DIAGNOSIS — H2513 Age-related nuclear cataract, bilateral: Secondary | ICD-10-CM | POA: Diagnosis not present

## 2017-07-16 DIAGNOSIS — H25041 Posterior subcapsular polar age-related cataract, right eye: Secondary | ICD-10-CM | POA: Diagnosis not present

## 2017-07-16 DIAGNOSIS — H35371 Puckering of macula, right eye: Secondary | ICD-10-CM | POA: Diagnosis not present

## 2017-08-04 DIAGNOSIS — L821 Other seborrheic keratosis: Secondary | ICD-10-CM | POA: Diagnosis not present

## 2017-08-04 DIAGNOSIS — D225 Melanocytic nevi of trunk: Secondary | ICD-10-CM | POA: Diagnosis not present

## 2017-08-04 DIAGNOSIS — L814 Other melanin hyperpigmentation: Secondary | ICD-10-CM | POA: Diagnosis not present

## 2017-08-04 DIAGNOSIS — C44619 Basal cell carcinoma of skin of left upper limb, including shoulder: Secondary | ICD-10-CM | POA: Diagnosis not present

## 2017-08-04 DIAGNOSIS — L578 Other skin changes due to chronic exposure to nonionizing radiation: Secondary | ICD-10-CM | POA: Diagnosis not present

## 2017-09-14 ENCOUNTER — Other Ambulatory Visit: Payer: Self-pay | Admitting: Obstetrics & Gynecology

## 2017-09-14 DIAGNOSIS — Z1231 Encounter for screening mammogram for malignant neoplasm of breast: Secondary | ICD-10-CM

## 2017-10-20 ENCOUNTER — Ambulatory Visit: Payer: BLUE CROSS/BLUE SHIELD

## 2017-10-21 ENCOUNTER — Ambulatory Visit
Admission: RE | Admit: 2017-10-21 | Discharge: 2017-10-21 | Disposition: A | Discharge status: Home / Self Care | Payer: BLUE CROSS/BLUE SHIELD | Source: Ambulatory Visit | Attending: Obstetrics & Gynecology | Admitting: Obstetrics & Gynecology

## 2017-10-21 DIAGNOSIS — Z1231 Encounter for screening mammogram for malignant neoplasm of breast: Secondary | ICD-10-CM | POA: Diagnosis not present

## 2017-10-25 ENCOUNTER — Telehealth: Payer: Self-pay | Admitting: Emergency Medicine

## 2017-10-25 NOTE — Telephone Encounter (Signed)
Call to patient.  She will call [facility] to set up appointment for Breast MRI.  She reports that her schedule did get busy with donating kidney. She will call back if she needs any assistance with scheduling.  Order is in place and does not expire until 06/2018.  In imaging hold.  Okay to close?

## 2017-10-25 NOTE — Telephone Encounter (Signed)
Yes, ok to close. Thank you.

## 2017-10-25 NOTE — Telephone Encounter (Signed)
-----  Message from Megan Salon, MD sent at 10/22/2017  4:04 AM EDT ----- Please call pt.  Her mammogram was normal.  She is BRCA 1 positive.  She was supposed to have an MRI earlier this year.  We percerted it twice in March and April but it looks like she cancelled this.  She was a kidney donor earlier this year so I don't know if that interfered in anyway.  I would highly recommend she proceed with breast MRI.  Last one was in 2017 and the recommendation is to do these yearly.  Thanks.

## 2018-02-11 ENCOUNTER — Ambulatory Visit: Payer: BLUE CROSS/BLUE SHIELD | Admitting: Obstetrics & Gynecology

## 2018-03-10 ENCOUNTER — Ambulatory Visit: Payer: BLUE CROSS/BLUE SHIELD | Admitting: Obstetrics & Gynecology

## 2018-03-10 ENCOUNTER — Encounter

## 2018-03-10 DIAGNOSIS — M79644 Pain in right finger(s): Secondary | ICD-10-CM | POA: Diagnosis not present

## 2018-03-10 NOTE — Progress Notes (Deleted)
63 y.o. G2P2002 Married White or Caucasian female here for annual exam.    Patient's last menstrual period was 02/03/2000.          Sexually active: {yes no:314532}  The current method of family planning is status post hysterectomy.    Exercising: {yes no:314532}  {types:19826} Smoker:  {YES [id number]  Health Maintenance: Pap:  12/21/14 Neg. HR HPV:neg  History of abnormal Pap:  no MMG:  10/21/17 BIRADS1:Neg  Colonoscopy:  2011 normal. F/u 10 years  BMD:   2010 TDaP:  2012 Pneumonia vaccine(s):  *** Shingrix:   *** Hep C testing: *** Screening Labs: ***   reports that she quit smoking about 42 years ago. Her smoking use included cigarettes. She has a 2.00 pack-year smoking history. She has never used smokeless tobacco. She reports current alcohol use of about 4.0 standard drinks of alcohol per week. She reports that she does not use drugs.  Past Medical History:  Diagnosis Date  . Arthritis   . BRCA1 gene mutation positive 06/28/2008   [facility] in [place].  Did not tolerate Tamoxofen from Dr. Lamonte Sakai  . DVT (deep venous thrombosis) ([place]) 12/2013   right leg  . Herniated lumbar disc without myelopathy    L 3-4-5  . 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 PONV (postoperative nausea and vomiting)    developed rash with knee replacement unsure of cause     Past Surgical History:  Procedure Laterality Date  . ABDOMINAL HYSTERECTOMY  2002   fibroids, DUB  . BUNIONECTOMY Left   . [place]   hernaited disc repair C neck 3-4  . COLONOSCOPY  10/23/2009   Dr. Collene Mares - normal  . LUMBAR FUSION  10/2012   L3.L4,L5,L6, fusion and repair  . MELANOMA EXCISION Left 11/2015   left upper arm, melanoma in-situ  . RETINAL DETACHMENT SURGERY Right 1993  . SALPINGOOPHORECTOMY Right 1988   secondary to cyst  . SALPINGOOPHORECTOMY Left 09/2008   secondary to BRCA +  . TOTAL HIP ARTHROPLASTY Right 11/28/2013   Procedure: TOTAL HIP ARTHROPLASTY  ANTERIOR APPROACH;  Surgeon: Hessie Dibble, MD;  Location: [facility];  Service: Orthopedics;  Laterality: Right;  . TOTAL KNEE ARTHROPLASTY Right 2007  . TUBAL LIGATION Bilateral 1987    No current outpatient medications on file.   No current facility-administered medications for this visit.     Family History  Problem Relation Age of Onset  . Ovarian cancer Paternal Grandmother   . Breast cancer Cousin        paternal  . Ovarian cancer Cousin        paternal  . Breast cancer Cousin        paternal  . Stroke Mother   . Heart failure Mother   . Colon cancer Brother 41  . Melanoma Sister   . Breast cancer Sister 66       BRCA negative  . Cervical cancer Cousin        paternal  . Leukemia Daughter     Review of Systems  Exam:   LMP 02/03/2000   Height:      Ht Readings from Last 3 Encounters:  11/30/16 5' 5.5" (1.664 m)  12/31/15 5' 5.75" (1.67 m)  12/21/14 5' 5.75" (1.67 m)    General appearance: alert, cooperative and appears stated age Head: Normocephalic, without obvious abnormality, atraumatic Neck: no adenopathy, supple, symmetrical, trachea midline and thyroid {EXAM; THYROID:18604} Lungs: clear  to auscultation bilaterally Breasts: {Exam; breast:13139::"normal appearance, no masses or tenderness"} Heart: regular rate and rhythm Abdomen: soft, non-tender; bowel sounds normal; no masses,  no organomegaly Extremities: extremities normal, atraumatic, no cyanosis or edema Skin: Skin color, texture, turgor normal. No rashes or lesions Lymph nodes: Cervical, supraclavicular, and axillary nodes normal. No abnormal inguinal nodes palpated Neurologic: Grossly normal   Pelvic: External genitalia:  no lesions              Urethra:  normal appearing urethra with no masses, tenderness or lesions              Bartholins and Skenes: normal                 Vagina: normal appearing vagina with normal color and discharge, no lesions              Cervix: {exam; cervix:14595}               Pap taken: {yes no:314532} Bimanual Exam:  Uterus:  {exam; uterus:12215}              Adnexa: {exam; adnexa:12223}               Rectovaginal: Confirms               Anus:  normal sphincter tone, no lesions  Chaperone was present for exam.  A:  Well Woman with normal exam  P:   {plan; gyn:5269::"mammogram","pap smear","return annually or prn"}

## 2018-03-15 ENCOUNTER — Ambulatory Visit (INDEPENDENT_AMBULATORY_CARE_PROVIDER_SITE_OTHER): Payer: BLUE CROSS/BLUE SHIELD | Admitting: Obstetrics & Gynecology

## 2018-03-15 ENCOUNTER — Other Ambulatory Visit: Payer: Self-pay

## 2018-03-15 ENCOUNTER — Encounter: Payer: Self-pay | Admitting: Obstetrics & Gynecology

## 2018-03-15 VITALS — BP 120/80 | HR 64 | Resp 16 | Ht 65.5 in | Wt 181.4 lb

## 2018-03-15 DIAGNOSIS — Z01419 Encounter for gynecological examination (general) (routine) without abnormal findings: Secondary | ICD-10-CM

## 2018-03-15 DIAGNOSIS — Z0000 Encounter for general adult medical examination without abnormal findings: Secondary | ICD-10-CM | POA: Diagnosis not present

## 2018-03-15 NOTE — Progress Notes (Signed)
63 y.o. G37P2002 Married White or Caucasian female here for annual exam.  Doing well.  Reports her donor is doing very well.    Denies vaginal bleeding.    Husband retiring in March.  PCP:  Office in [place] closed.  Going to [facility] urgent care.    Patient's last menstrual period was 02/03/2000.          Sexually active: Yes.    The current method of family planning is status post hysterectomy.    Exercising: Yes.    aerobics Smoker:  no  Health Maintenance: Pap:  12/21/14 neg. HR HPV:neg  History of abnormal Pap:  no MMG:  10/21/17 BIRADS1:neg  Colonoscopy:  10/2009.  Normal. f/u 10 years.  BMD:   08/10/08 Normal.  Will do at age 8.   TDaP: 09/19/2010 Pneumonia vaccine(s):  n/a Shingrix:   Declines now Hep C testing: done  Screening Labs: lipids, TSH, and Vit D obtained today   reports that she quit smoking about 42 years ago. Her smoking use included cigarettes. She has a 2.00 pack-year smoking history. She has never used smokeless tobacco. She reports current alcohol use of about 4.0 standard drinks of alcohol per week. She reports that she does not use drugs.  Past Medical History:  Diagnosis Date  . Arthritis   . BRCA1 gene mutation positive 06/28/2008   [facility] in [place].  Did not tolerate Tamoxofen from Dr. Lamonte Sakai  . DVT (deep venous thrombosis) ([place]) 12/2013   right leg  . Herniated lumbar disc without myelopathy    L 3-4-5  . 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 Melanoma in situ of left upper arm ([place]) 2017  . PONV (postoperative nausea and vomiting)    developed rash with knee replacement unsure of cause     Past Surgical History:  Procedure Laterality Date  . ABDOMINAL HYSTERECTOMY  2002   fibroids, DUB  . BUNIONECTOMY Left   . [place]   hernaited disc repair C neck 3-4  . COLONOSCOPY  10/23/2009   Dr. Collene Mares - normal  . LUMBAR FUSION  10/2012   L3.L4,L5,L6, fusion and repair  . MELANOMA  EXCISION Left 11/2015   left upper arm, melanoma in-situ  . RETINAL DETACHMENT SURGERY Right 1993  . SALPINGOOPHORECTOMY Right 1988   secondary to cyst  . SALPINGOOPHORECTOMY Left 09/2008   secondary to BRCA +  . TOTAL HIP ARTHROPLASTY Right 11/28/2013   Procedure: TOTAL HIP ARTHROPLASTY ANTERIOR APPROACH;  Surgeon: Hessie Dibble, MD;  Location: [facility];  Service: Orthopedics;  Laterality: Right;  . TOTAL KNEE ARTHROPLASTY Right 2007  . TUBAL LIGATION Bilateral 1987    Current Outpatient Medications  Medication Sig Dispense Refill  . acetaminophen (TYLENOL) 325 MG tablet Take 650 mg by mouth every 6 (six) hours as needed.     No current facility-administered medications for this visit.     Family History  Problem Relation Age of Onset  . Ovarian cancer Paternal Grandmother   . Breast cancer Cousin        paternal  . Ovarian cancer Cousin        paternal  . Breast cancer Cousin        paternal  . Stroke Mother   . Heart failure Mother   . Colon cancer Brother 12  . Melanoma Sister   . Breast cancer Sister 61       BRCA negative  .  Cervical cancer Cousin        paternal  . Leukemia Daughter     Review of Systems  All other systems reviewed and are negative.   Exam:   BP 120/80 (BP Location: Right Arm, Patient Position: Sitting, Cuff Size: Large)   Pulse 64   Resp 16   Ht 5' 5.5" (1.664 m)   Wt 181 lb 6.4 oz (82.3 kg)   LMP 02/03/2000   BMI 29.73 kg/m    Height: 5' 5.5" (166.4 cm)  Ht Readings from Last 3 Encounters:  03/15/18 5' 5.5" (1.664 m)  11/30/16 5' 5.5" (1.664 m)  12/31/15 5' 5.75" (1.67 m)    General appearance: alert, cooperative and appears stated age Head: Normocephalic, without obvious abnormality, atraumatic Neck: no adenopathy, supple, symmetrical, trachea midline and thyroid normal to inspection and palpation Lungs: clear to auscultation bilaterally Breasts: normal appearance, no masses or tenderness Heart: regular rate and  rhythm Abdomen: soft, non-tender; bowel sounds normal; no masses,  no organomegaly Extremities: extremities normal, atraumatic, no cyanosis or edema Skin: Skin color, texture, turgor normal. No rashes or lesions Lymph nodes: Cervical, supraclavicular, and axillary nodes normal. No abnormal inguinal nodes palpated Neurologic: Grossly normal   Pelvic: External genitalia:  no lesions              Urethra:  normal appearing urethra with no masses, tenderness or lesions              Bartholins and Skenes: normal                 Vagina: normal appearing vagina with normal color and discharge, no lesions              Cervix: absent              Pap taken: No. Bimanual Exam:  Uterus:  uterus absent              Adnexa: no mass, fullness, tenderness               Rectovaginal: Confirms               Anus:  normal sphincter tone, no lesions  Chaperone was present for exam.  A:  Well Woman with normal exam PMP, no HRT H/O BRCA1 gene mutation H/O TAH 2002 due to fibroids and DUB, then [place], and LSO 8/10 OA with right hip replacement 10/15, with post DVT (coagulopathy testing was negative) Family hx of colon cancer in her brother  P:   Mammogram guidelines Reviewed this.  MRI is planned for March, 2020 pap smear not indicated Colonoscopy due next year Lipids, TSH, Vit D obtained (renal function followed by nephrologist) Shingrix vaccination discussed.  Declines today. return annually or prn

## 2018-03-16 LAB — LIPID PANEL
CHOL/HDL RATIO: 2.5 ratio (ref 0.0–4.4)
Cholesterol, Total: 197 mg/dL (ref 100–199)
HDL: 79 mg/dL (ref >39)
LDL Calculated: 95 mg/dL (ref 0–99)
TRIGLYCERIDES: 116 mg/dL (ref 0–149)
VLDL Cholesterol Cal: 23 mg/dL (ref 5–40)

## 2018-03-16 LAB — VITAMIN D 25 HYDROXY (VIT D DEFICIENCY, FRACTURES): Vit D, 25-Hydroxy: 18 ng/mL — ABNORMAL LOW (ref 30.0–100.0)

## 2018-03-16 LAB — TSH: TSH: 1.63 u[IU]/mL (ref 0.450–4.500)

## 2018-03-17 ENCOUNTER — Telehealth: Payer: Self-pay | Admitting: Emergency Medicine

## 2018-03-17 DIAGNOSIS — Z1231 Encounter for screening mammogram for malignant neoplasm of breast: Secondary | ICD-10-CM

## 2018-03-17 NOTE — Telephone Encounter (Signed)
-----  Message from Megan Salon, MD sent at 03/15/2018 11:18 AM EST ----- Regarding: breast MRI Pt has BRCA 1 gene mutation.  She needs to have a breat MRI scheduled in mid March.  Can you work on this?  Thanks.  Vinnie Level

## 2018-03-17 NOTE — Telephone Encounter (Signed)
Order placed for Bilateral Breast MRI.  Called [facility].  They will contact patient to schedule.

## 2018-03-18 ENCOUNTER — Telehealth: Payer: Self-pay | Admitting: *Deleted

## 2018-03-18 DIAGNOSIS — E559 Vitamin D deficiency, unspecified: Secondary | ICD-10-CM

## 2018-03-18 NOTE — Telephone Encounter (Signed)
-----   Message from Jerene Bears, MD sent at 03/18/2018 12:34 AM EST ----- Please let pt know her Lipids were normal and TSH was normal.  Vit D was 18.  Would recommend Vit D 50 K with repeat in 12 weeks and then likely just OTC Vit D.  Ok to place rx order and lab order if pt is willing to take this.  Thanks.

## 2018-03-18 NOTE — Telephone Encounter (Signed)
LM for pt to call back.

## 2018-03-21 MED ORDER — VITAMIN D (ERGOCALCIFEROL) 1.25 MG (50000 UNIT) PO CAPS: 50000.0000 [IU] | ORAL_CAPSULE | ORAL | 0 refills | Status: AC

## 2018-03-21 NOTE — Telephone Encounter (Signed)
Pt notified. Verbalized understanding. Lab appt scheduled.  Rx sent to pharmacy  

## 2018-03-23 DIAGNOSIS — M19071 Primary osteoarthritis, right ankle and foot: Secondary | ICD-10-CM | POA: Diagnosis not present

## 2018-03-23 DIAGNOSIS — M7742 Metatarsalgia, left foot: Secondary | ICD-10-CM | POA: Diagnosis not present

## 2018-03-25 ENCOUNTER — Ambulatory Visit: Payer: BLUE CROSS/BLUE SHIELD | Admitting: Obstetrics & Gynecology

## 2018-04-20 ENCOUNTER — Other Ambulatory Visit: Payer: BLUE CROSS/BLUE SHIELD

## 2018-04-21 ENCOUNTER — Ambulatory Visit
Admission: RE | Admit: 2018-04-21 | Discharge: 2018-04-21 | Disposition: A | Discharge status: Home / Self Care | Payer: BLUE CROSS/BLUE SHIELD | Source: Ambulatory Visit | Attending: Obstetrics & Gynecology | Admitting: Obstetrics & Gynecology

## 2018-04-21 ENCOUNTER — Other Ambulatory Visit: Payer: Self-pay

## 2018-04-21 ENCOUNTER — Telehealth: Payer: Self-pay | Admitting: Emergency Medicine

## 2018-04-21 DIAGNOSIS — R918 Other nonspecific abnormal finding of lung field: Secondary | ICD-10-CM

## 2018-04-21 DIAGNOSIS — Z1231 Encounter for screening mammogram for malignant neoplasm of breast: Secondary | ICD-10-CM

## 2018-04-21 DIAGNOSIS — R9089 Other abnormal findings on diagnostic imaging of central nervous system: Secondary | ICD-10-CM

## 2018-04-21 DIAGNOSIS — R9389 Abnormal findings on diagnostic imaging of other specified body structures: Secondary | ICD-10-CM

## 2018-04-21 DIAGNOSIS — Z1501 Genetic susceptibility to malignant neoplasm of breast: Secondary | ICD-10-CM | POA: Diagnosis not present

## 2018-04-21 MED ORDER — GADOBUTROL 1 MMOL/ML IV SOLN
8.0000 mL | Freq: Once | INTRAVENOUS | Status: AC | PRN
  Administered 2018-04-21: 8 mL via INTRAVENOUS

## 2018-04-21 NOTE — Telephone Encounter (Signed)
Olegario Messier from the breast center calling.   MRI completed and results in Epic.  Radiologist recommends follow up with Chest CT.  Routing to Dr. Hyacinth Meeker

## 2018-04-26 NOTE — Telephone Encounter (Signed)
-----   Message from Jerene Bears, MD sent at 04/26/2018 10:37 AM EDT ----- Spoke with pt regarding breast MRI and need for chest CT.  Pt voices understanding and is willing to proceed.

## 2018-04-26 NOTE — Telephone Encounter (Signed)
Called pt personally.  She is aware chest CT needed.  Routed result to Nicole Casey to schedule.  Ok to close encounter.

## 2018-04-26 NOTE — Telephone Encounter (Signed)
Order placed for CT Chest with contrast at [facility].   Call placed to [facility], was advised due to Covid-19 routine imaging will resume after 06/20/18.   Call placed to [facility], only scheduling STAT imaging at all locations, routine screening to resume after 06/06/18.   Call to [facility] main scheduling, only scheduling STAT imaging at this time, routine screening will resume in 4wks.   Call to patient, advised as seen above. Order placed to [facility]. Patient states her insurance will change on 4/31/20, is unsure of her new plan. Patient will plan on imaging at [facility], will return call to office if desire order for another imaging location.   Routing to provider for final review. Patient is agreeable to disposition. Will close encounter.  Cc: Harland Dingwall, [place]

## 2018-04-28 ENCOUNTER — Telehealth: Payer: Self-pay | Admitting: Emergency Medicine

## 2018-04-28 DIAGNOSIS — J90 Pleural effusion, not elsewhere classified: Secondary | ICD-10-CM

## 2018-04-28 NOTE — Telephone Encounter (Signed)
Ordered Chest CT with IV contrast.  J code 16109 Dx J90 Pleural effusion.  No clinical questions to answer.  Approval ID number [id number] Valid today through 07/26/2018.   Exam ordered as STAT.   Notified patient that Dr. Hyacinth Meeker would like her to have CT earlier than previously discussed.  Pt agreeable. Advised can walk in to [facility] to have scan.  We will call with results.  Pt agreeable.   cc Thomasene Lot Dixon/Rosa Davis.

## 2018-04-29 ENCOUNTER — Ambulatory Visit
Admission: RE | Admit: 2018-04-29 | Discharge: 2018-04-29 | Disposition: A | Discharge status: Home / Self Care | Payer: BLUE CROSS/BLUE SHIELD | Source: Ambulatory Visit | Attending: Obstetrics & Gynecology | Admitting: Obstetrics & Gynecology

## 2018-04-29 ENCOUNTER — Other Ambulatory Visit: Payer: Self-pay

## 2018-04-29 DIAGNOSIS — J90 Pleural effusion, not elsewhere classified: Secondary | ICD-10-CM

## 2018-04-29 MED ORDER — IOPAMIDOL (ISOVUE-300) INJECTION 61%
75.0000 mL | Freq: Once | INTRAVENOUS | Status: AC | PRN
  Administered 2018-04-29: 75 mL via INTRAVENOUS

## 2018-05-04 ENCOUNTER — Telehealth: Payer: Self-pay | Admitting: Obstetrics & Gynecology

## 2018-05-04 NOTE — Telephone Encounter (Signed)
Spoke with patient. Patient states that she feels very bloated and like she is "walking around in a bubble" around her abdomen. CT scan showed Partially visualized new diastasis of midline high ventral abdominal wall. Asking if there is anything she can do about this that may help with her symptoms. Advised will review with MD and return call. Patient is agreeable.

## 2018-05-04 NOTE — Telephone Encounter (Signed)
Patient is calling to speak with a nurse with follow up questions from CT Scan.

## 2018-05-04 NOTE — Telephone Encounter (Signed)
She should be evaluated if she is having new symptoms of bloating. The diastasis in her upper abdominal wall is just a slight separation of her rectus muscles. If she is noticing a bulge in that area, she could try a binder. Is she having any other bowel symptoms? If so she should see primary MD or GI. She does have a family history of colon cancer and is due for a colonoscopy next year. It would probably be a good idea to call her GI.

## 2018-05-05 NOTE — Telephone Encounter (Signed)
Call to patient. Advised of instructions from Dr Oscar La. Patient states she does not feel this is a new symptom.  Discussed abdominal binder for comfort and PCP or GI follow-up of bloating if continues.  Encounter closed.

## 2018-05-31 IMAGING — US US RENAL
1 series · 14 of 25 positions shown · non-contrast
Comparison: None.

CLINICAL DATA: 61 y/o  F; potential donor.

EXAM:
RENAL / URINARY TRACT ULTRASOUND COMPLETE

[Series 1: us renal · 0.23mm/px · 14 of 31 slices shown]
[im 1/31]
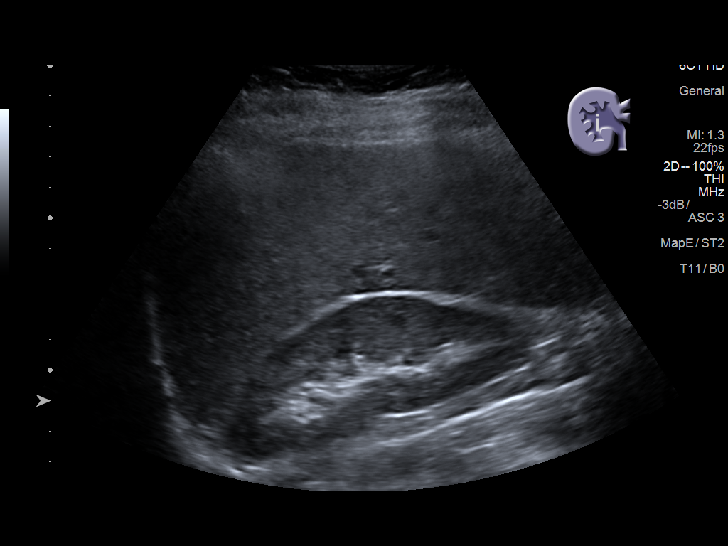
[im 3/31]
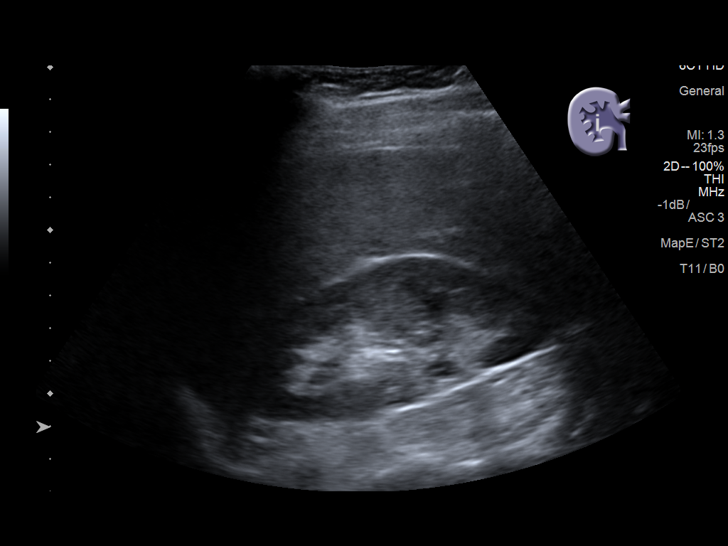
[im 6/31]
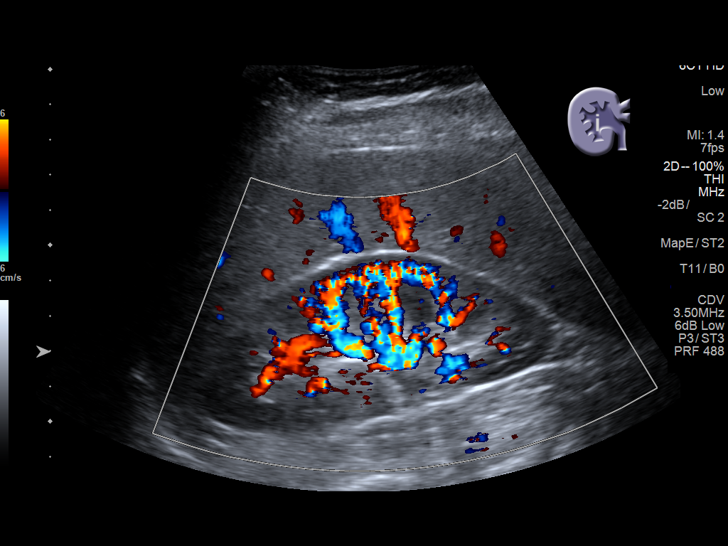
[im 8/31]
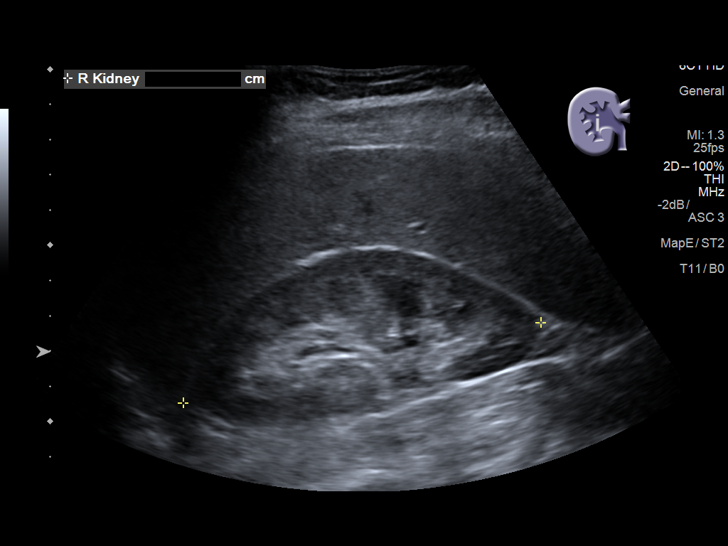
[im 11/31]
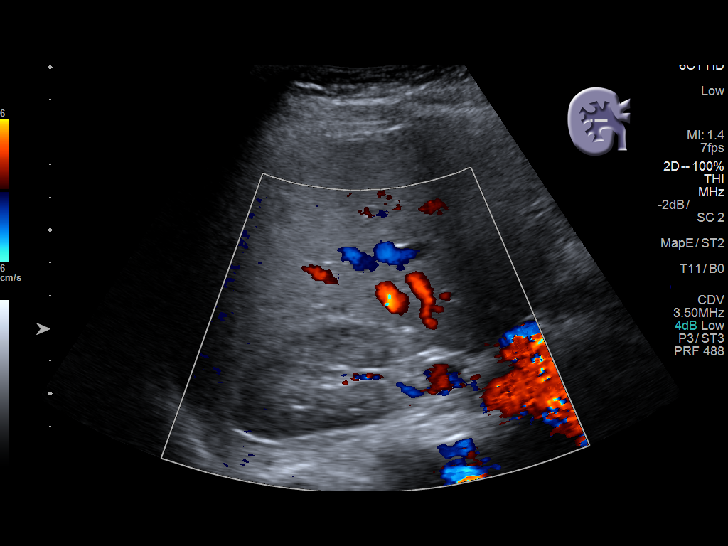
[im 12/31]
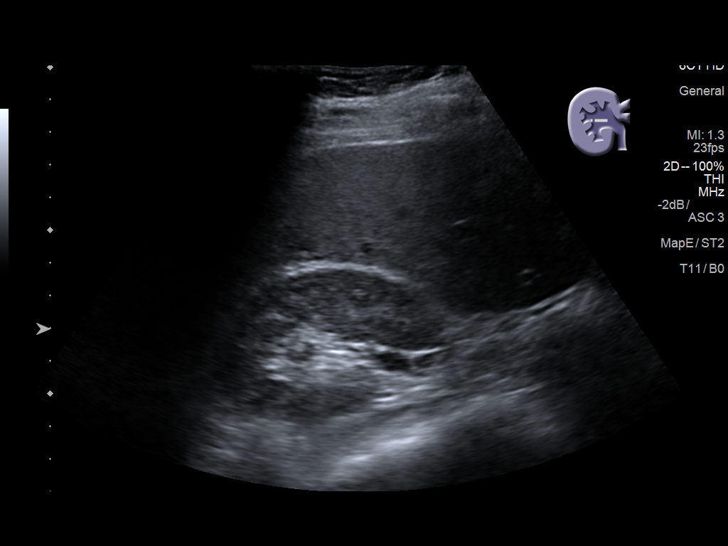
[im 14/31]
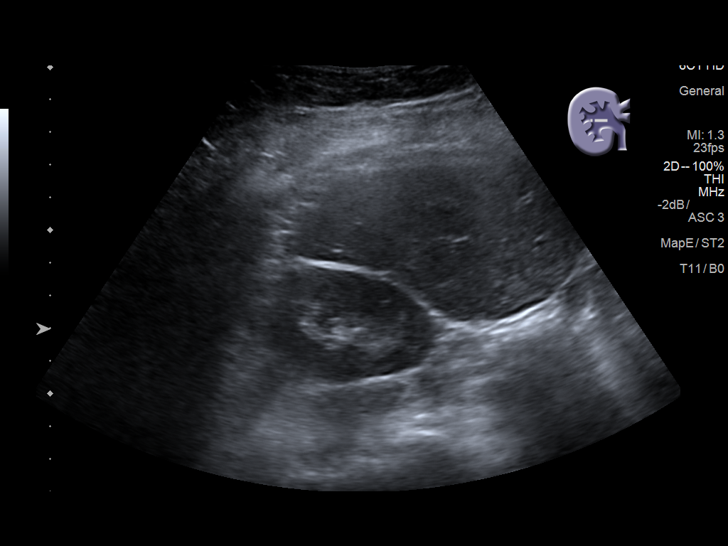
[im 17/31]
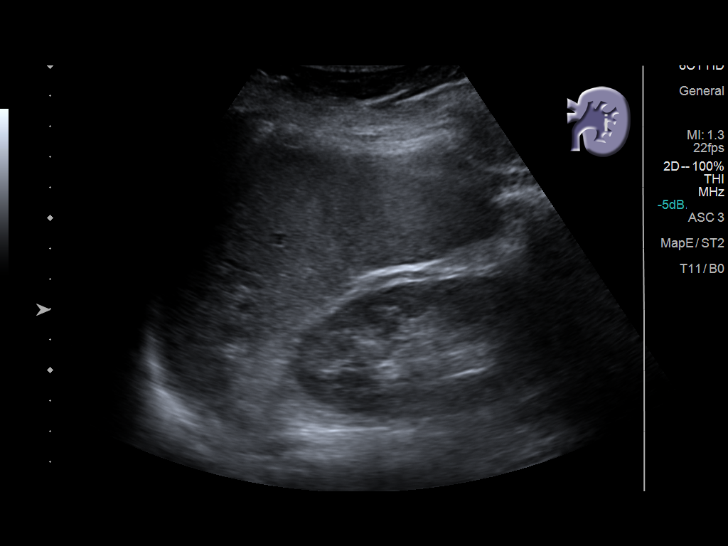
[im 19/31]
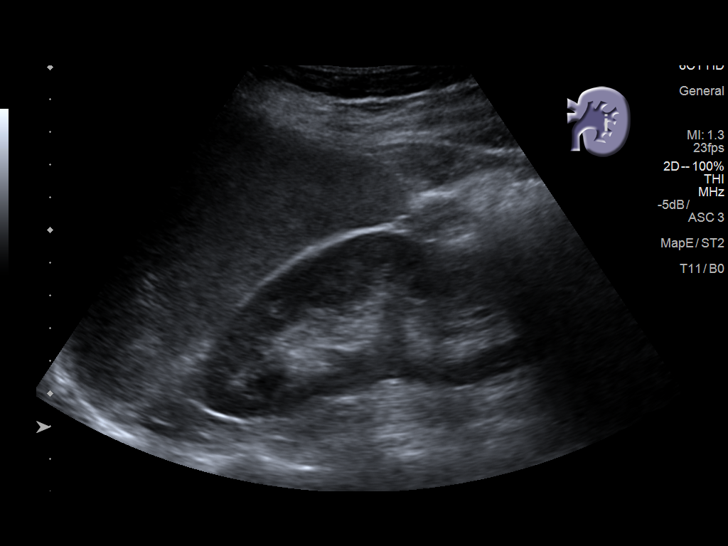
[im 21/31]
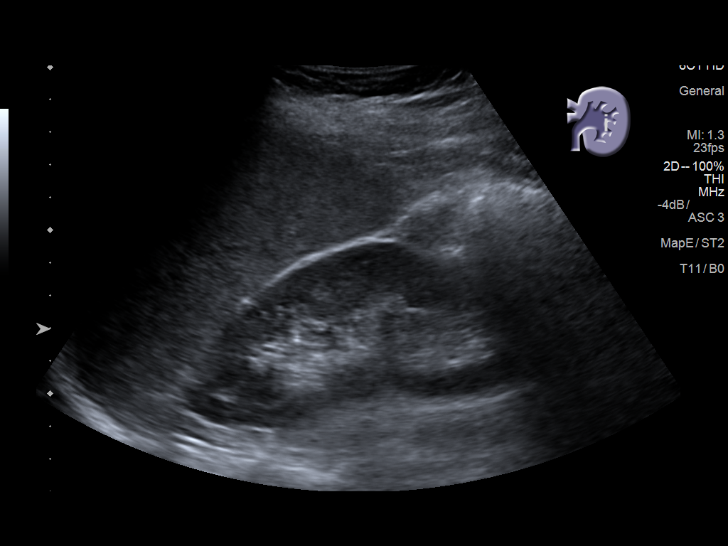
[im 23/31]
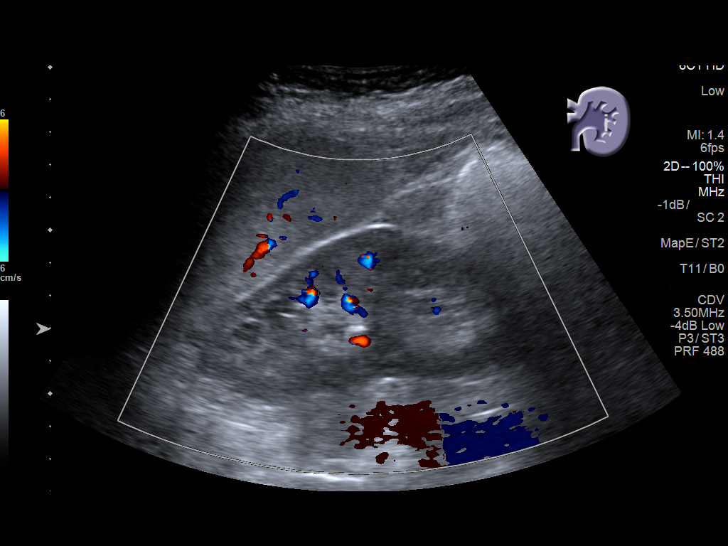
[im 26/31]
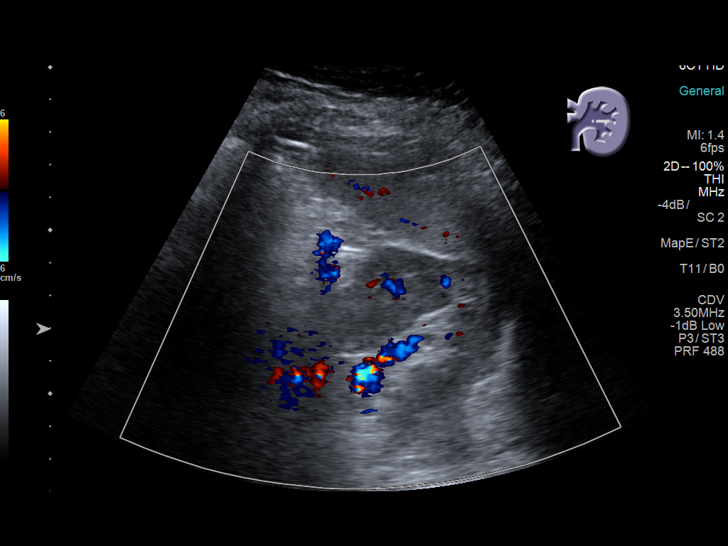
[im 28/31]
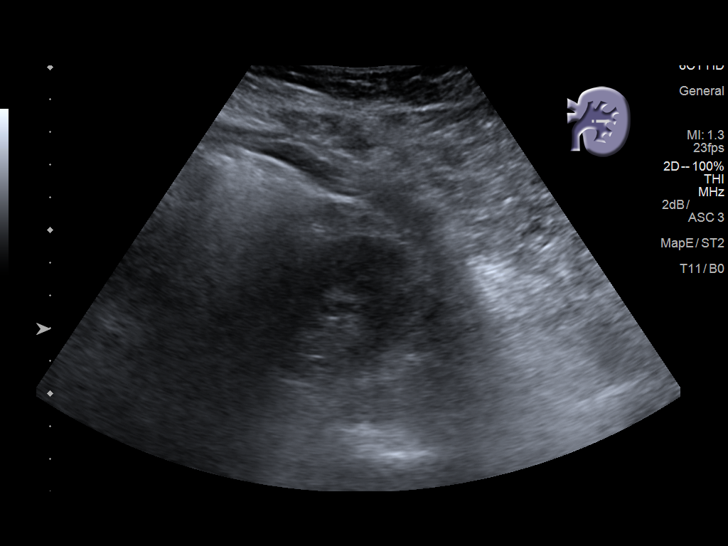
[im 31/31]
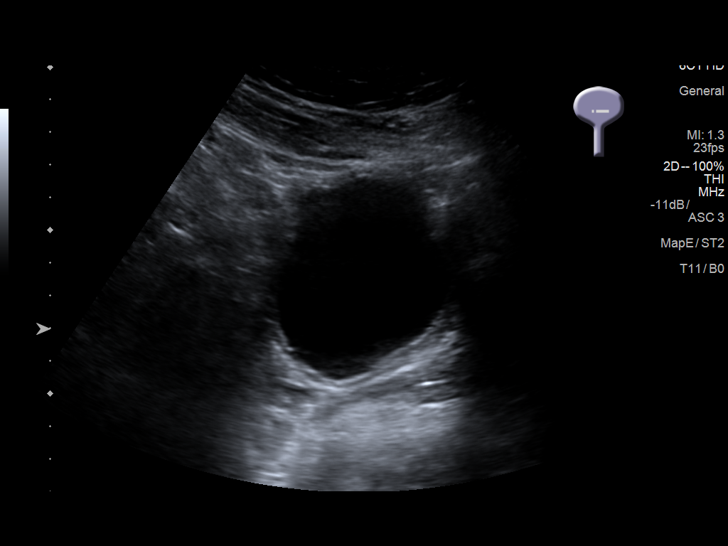

[14 of 25 positions shown; findings below may reference images not displayed]

FINDINGS: Right Kidney:

Length: 10.4 cm. Echogenicity within normal limits. No mass or
hydronephrosis visualized.

Left Kidney:

Length: 11.3 cm. Echogenicity within normal limits. No mass or
hydronephrosis visualized.

Bladder:

Appears normal for degree of bladder distention.
IMPRESSION: Normal renal ultrasound.

By: Bejko Temur M.D.

## 2018-06-17 ENCOUNTER — Ambulatory Visit: Payer: BLUE CROSS/BLUE SHIELD | Admitting: Obstetrics & Gynecology

## 2018-06-20 ENCOUNTER — Other Ambulatory Visit: Payer: Self-pay

## 2018-06-21 ENCOUNTER — Other Ambulatory Visit: Payer: BLUE CROSS/BLUE SHIELD

## 2018-06-23 ENCOUNTER — Other Ambulatory Visit: Payer: Self-pay

## 2018-06-24 ENCOUNTER — Other Ambulatory Visit: Payer: BLUE CROSS/BLUE SHIELD

## 2018-06-24 ENCOUNTER — Telehealth: Payer: Self-pay | Admitting: Obstetrics & Gynecology

## 2018-06-24 NOTE — Telephone Encounter (Signed)
Patient cancelled lab appointment today. See staff message

## 2018-07-08 DIAGNOSIS — M1712 Unilateral primary osteoarthritis, left knee: Secondary | ICD-10-CM | POA: Diagnosis not present

## 2018-07-26 IMAGING — US US ABDOMEN COMPLETE
1 series · 13 of 25 positions shown · non-contrast
Comparison: CT on 12/09/2016

CLINICAL DATA: Indeterminate right hepatic lobe lesion on recent
CT. Evaluation for renal transplant living donor.

EXAM:
ABDOMEN ULTRASOUND COMPLETE

[Series 1: us abdomen complete · 0.19mm/px · 13 of 99 slices shown]
[im 1/99]
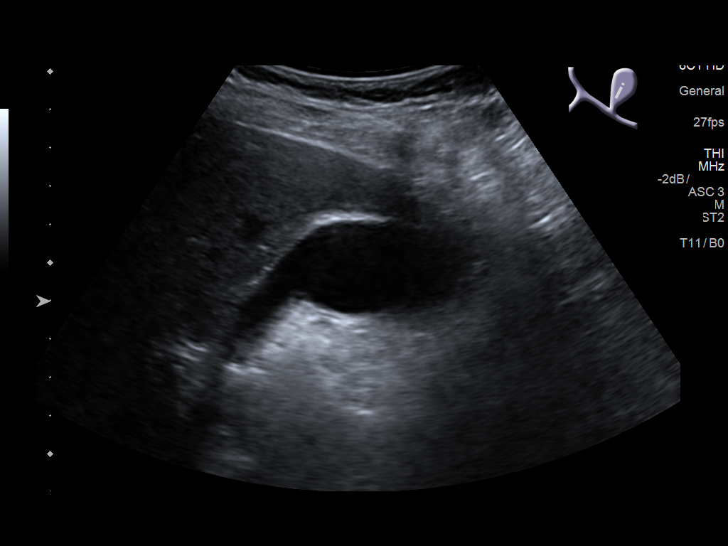
[im 9/99]
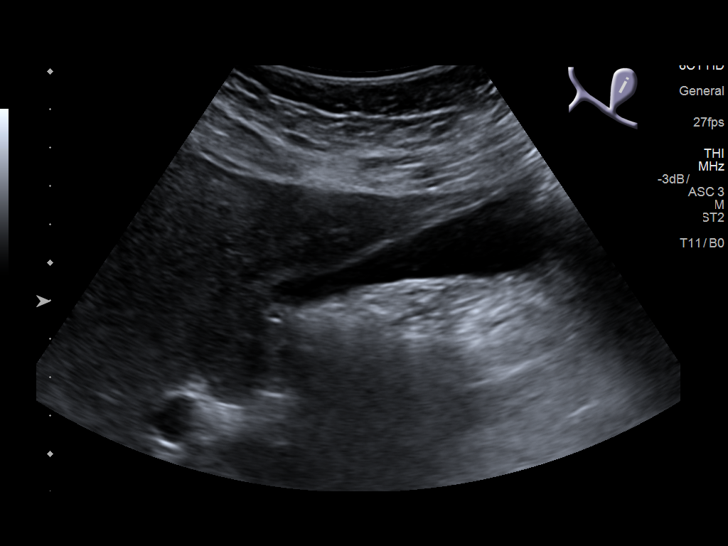
[im 17/99]
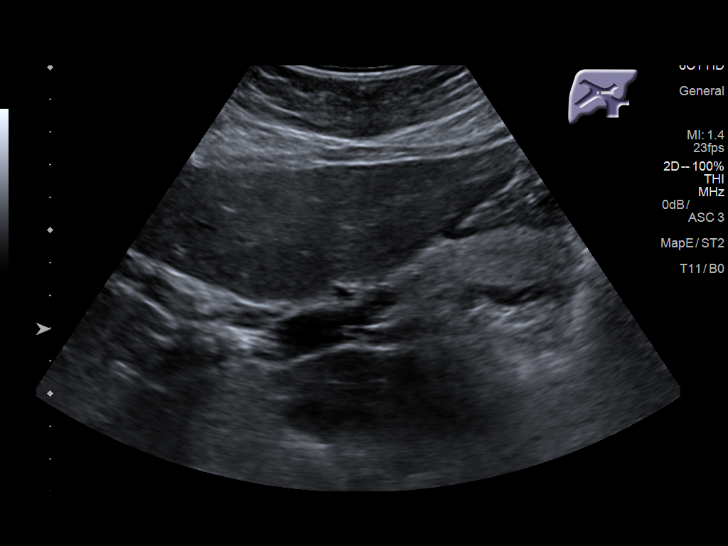
[im 25/99]
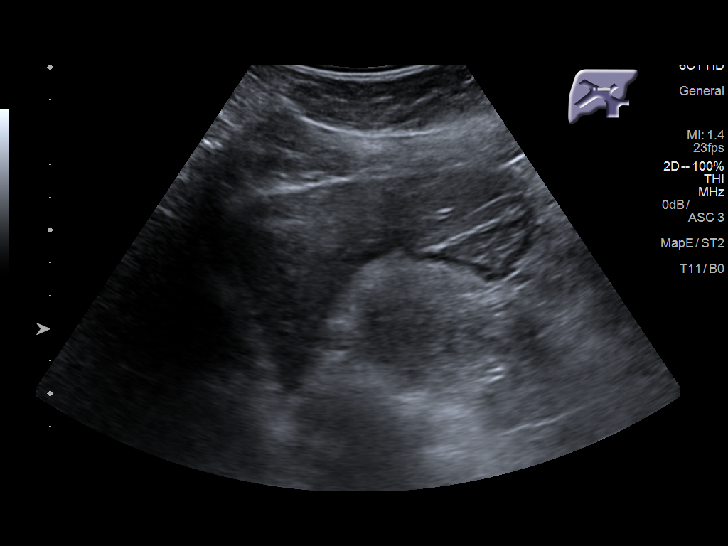
[im 33/99]
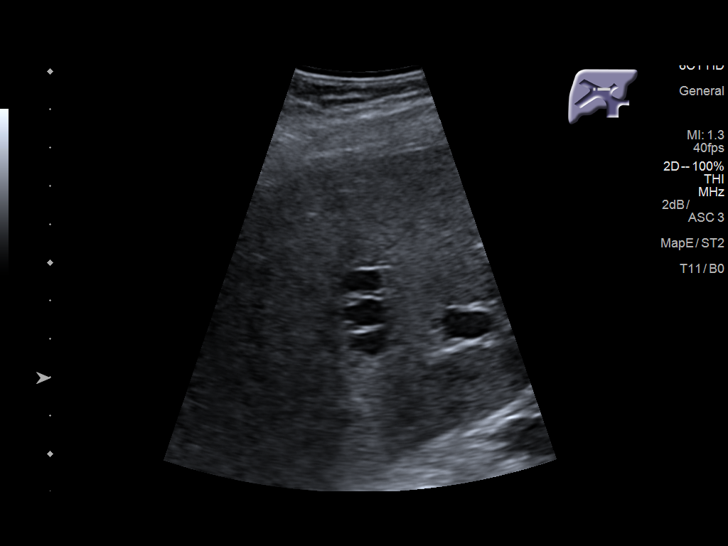
[im 41/99]
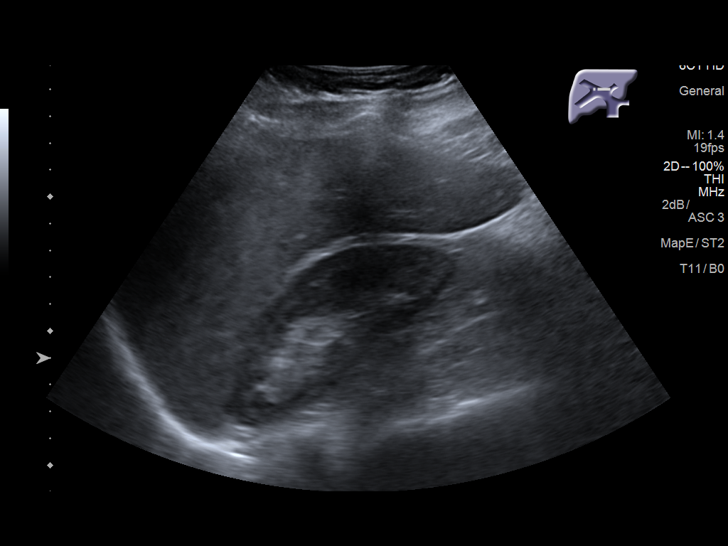
[im 50/99]
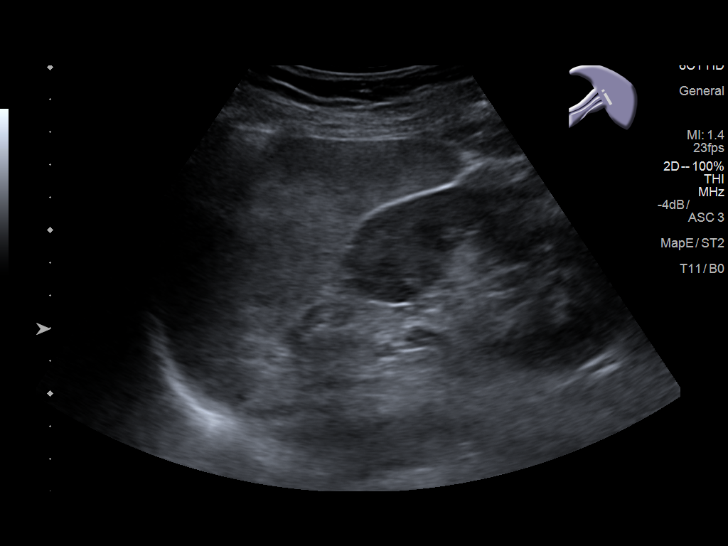
[im 58/99]
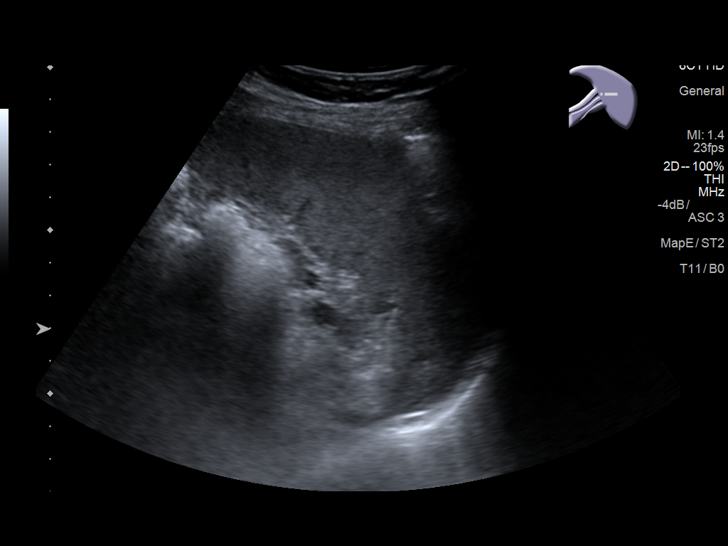
[im 66/99]
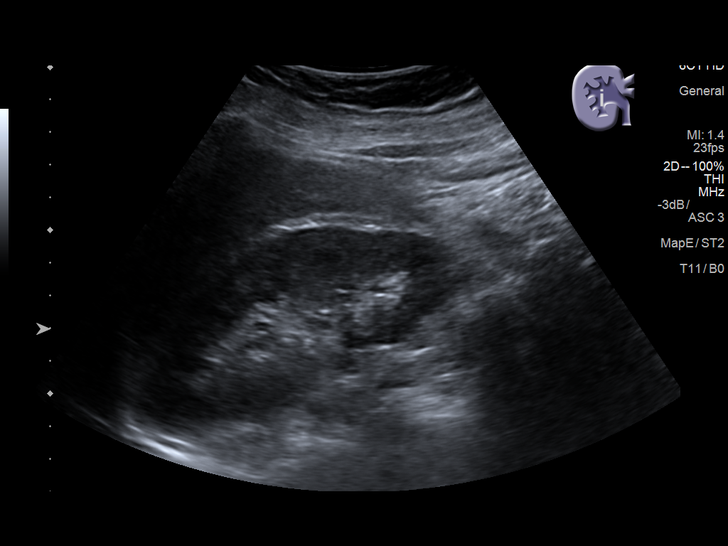
[im 74/99]
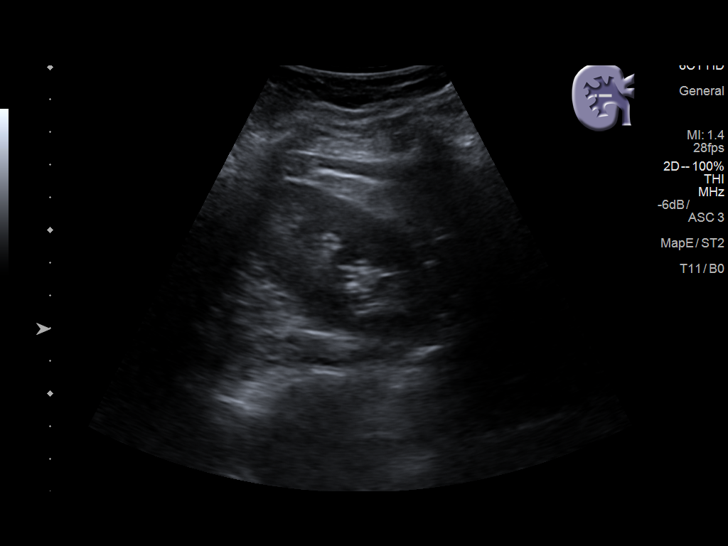
[im 82/99]
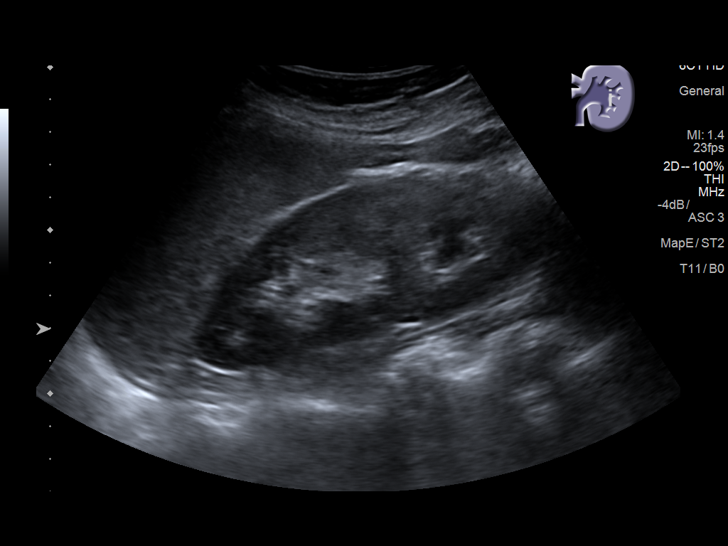
[im 90/99]
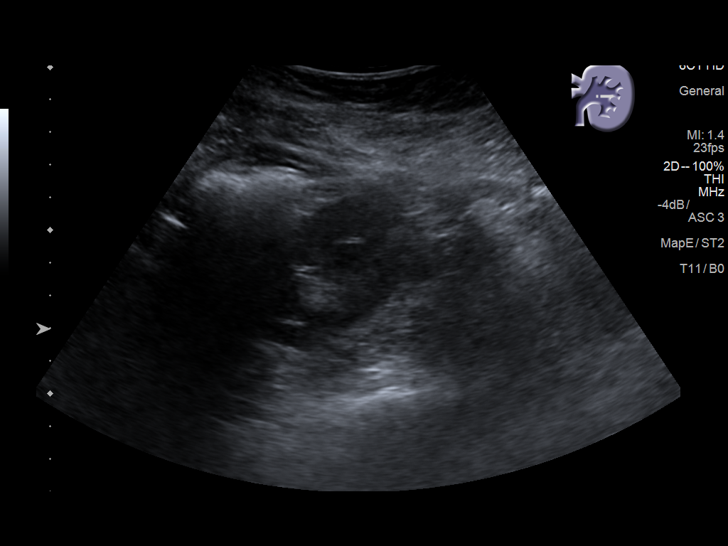
[im 99/99]
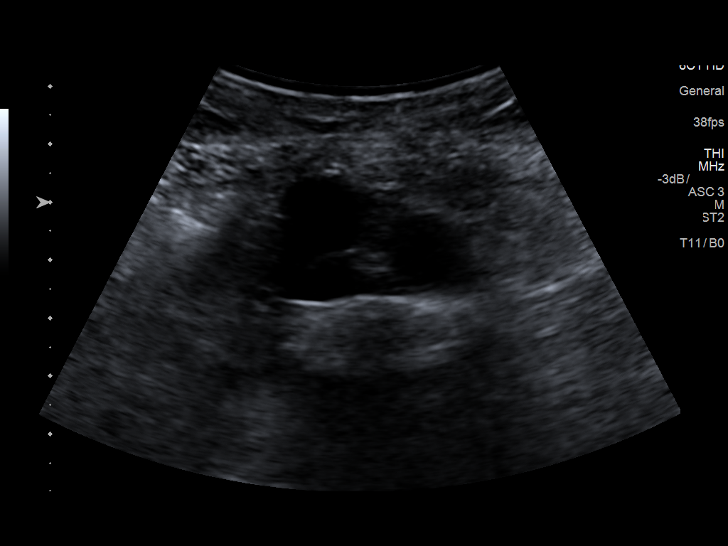

[13 of 25 positions shown; findings below may reference images not displayed]

FINDINGS: Gallbladder: No gallstones or wall thickening visualized. No
sonographic Murphy sign noted by sonographer.

Common bile duct: Diameter: 4 mm, within normal limits.

Liver: 2 adjacent cysts are seen in the central right hepatic lobe
measuring 1-2 cm, which correspond with the lesion seen on recent
CT. No solid liver mass identified. Within normal limits in
parenchymal echogenicity. Portal vein is patent on color Doppler
imaging with normal direction of blood flow towards the liver.

IVC: No abnormality visualized.

Pancreas: Visualized portion unremarkable.

Spleen: Size and appearance within normal limits.

Right Kidney: Length: 10.9 cm. Echogenicity within normal limits. No
mass or hydronephrosis visualized.

Left Kidney: Length: 11.2 cm. Echogenicity within normal limits. No
mass or hydronephrosis visualized.

Abdominal aorta: No aneurysm visualized.

Other findings: None.
IMPRESSION: Small benign-appearing right hepatic lobe cysts. No evidence of
hepatic mass or other significant abnormality.

## 2018-08-10 DIAGNOSIS — D1801 Hemangioma of skin and subcutaneous tissue: Secondary | ICD-10-CM | POA: Diagnosis not present

## 2018-08-10 DIAGNOSIS — L821 Other seborrheic keratosis: Secondary | ICD-10-CM | POA: Diagnosis not present

## 2018-08-10 DIAGNOSIS — Z1283 Encounter for screening for malignant neoplasm of skin: Secondary | ICD-10-CM | POA: Diagnosis not present

## 2018-08-10 DIAGNOSIS — Z86006 Personal history of melanoma in-situ: Secondary | ICD-10-CM | POA: Diagnosis not present

## 2018-08-24 ENCOUNTER — Telehealth: Payer: Self-pay | Admitting: Obstetrics & Gynecology

## 2018-08-24 NOTE — Telephone Encounter (Signed)
Patient is calling regarding "a ball in her upper abdomin." Patient stated that she had an ultrasound and a CT scan. Our office is no longer in network with her insurance and she is looking for a suggestion from Dr. Sabra Heck as to who she should see.

## 2018-08-27 IMAGING — CT CT ANGIO ABDOMEN
2 of 12 series · 12 of 46 positions shown, 17 images · IV contrast (APPLIED)
Comparison: 07/22/2012

CLINICAL DATA: 61-year-old female, potential candidate for organ
donation

EXAM:
CT ANGIOGRAPHY ABDOMEN
TECHNIQUE: Multidetector CT imaging of the abdomen was performed using the
standard protocol during bolus administration of intravenous
contrast. Multiplanar reconstructed images and MIPs were obtained
and reviewed to evaluate the vascular anatomy.
CONTRAST:  100 cc Isovue 370

[Series 13: venous thins · axial · portal-venous · 0.71mm/px · z∈[+831,+1083]mm · 10 of 756 slices shown, 15 images]
[im 63/756  soft-tissue]
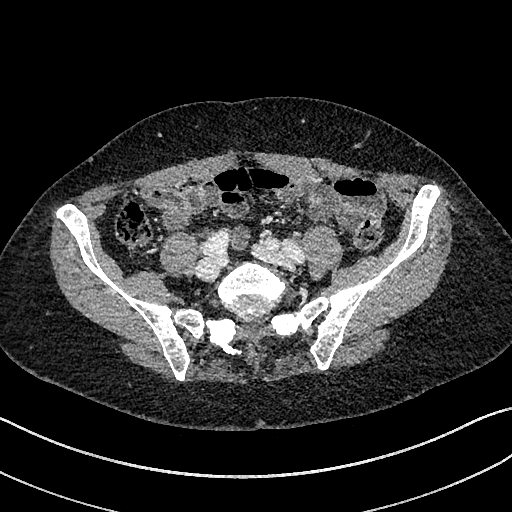
[im 63/756  bone]
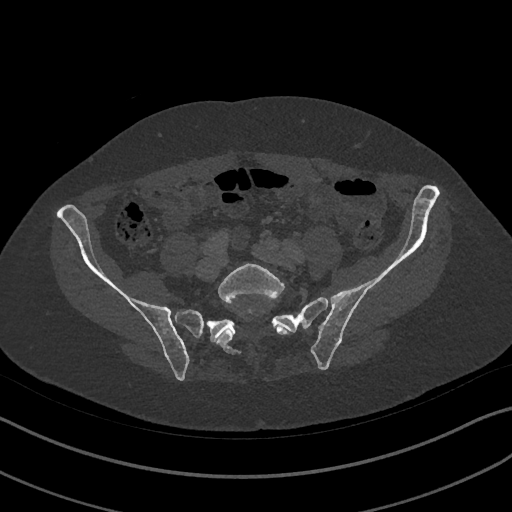
[im 126/756  soft-tissue]
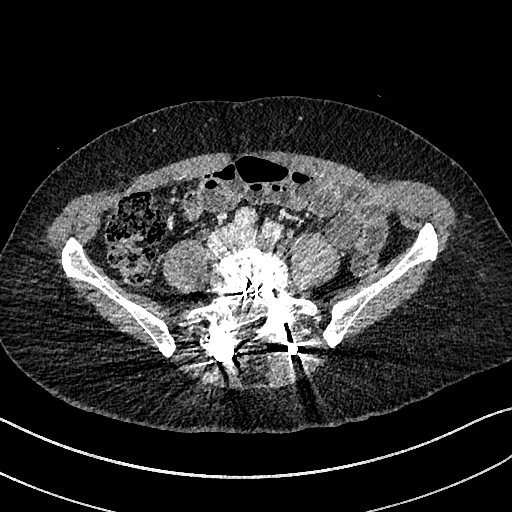
[im 252/756  soft-tissue]
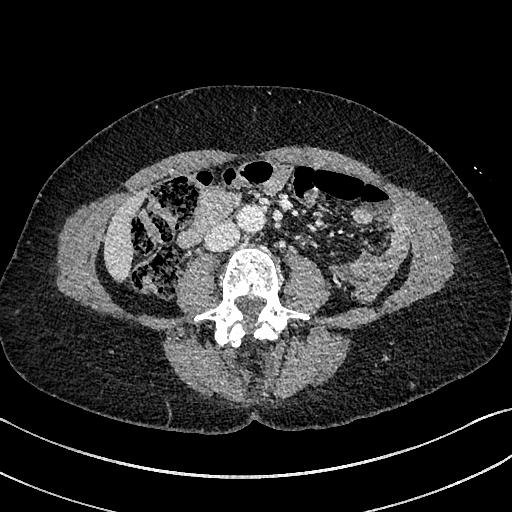
[im 315/756  soft-tissue]
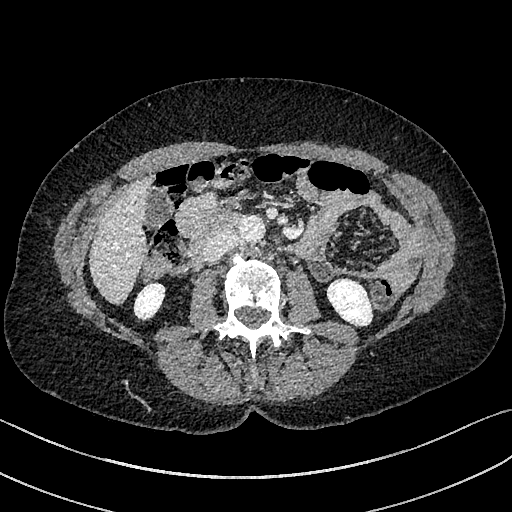
[im 378/756  soft-tissue]
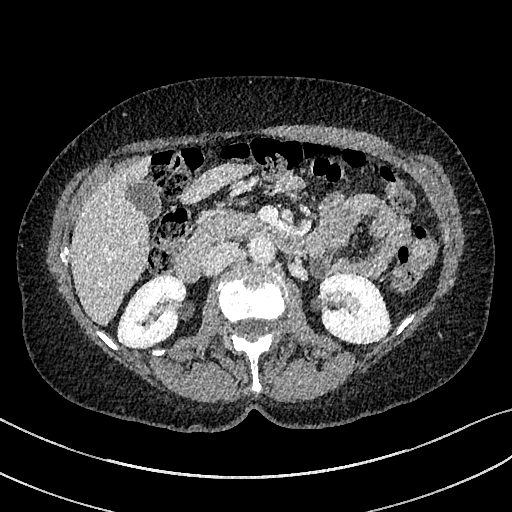
[im 441/756  soft-tissue]
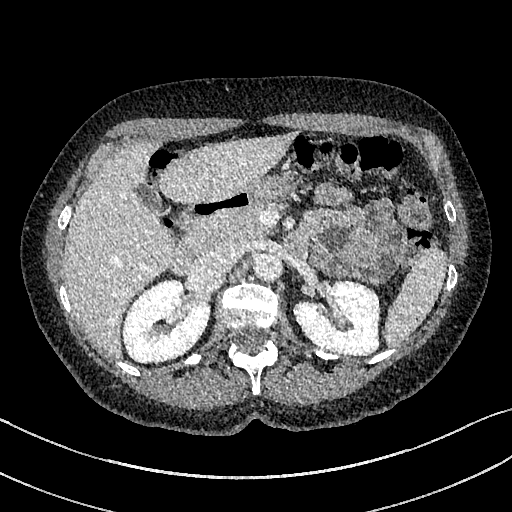
[im 504/756  soft-tissue]
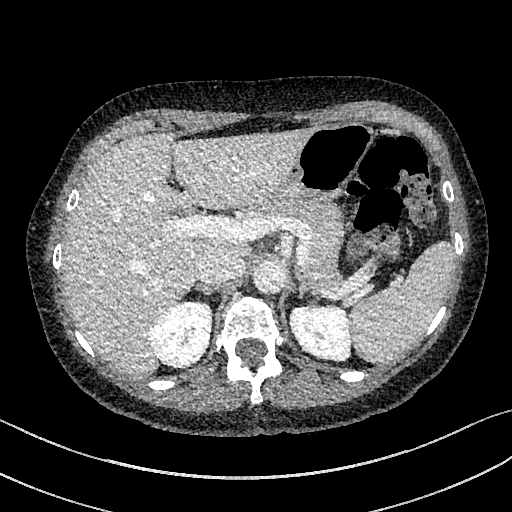
[im 504/756  lung]
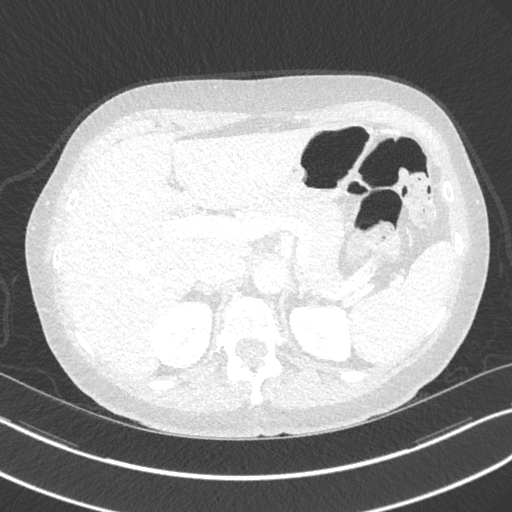
[im 567/756  lung]
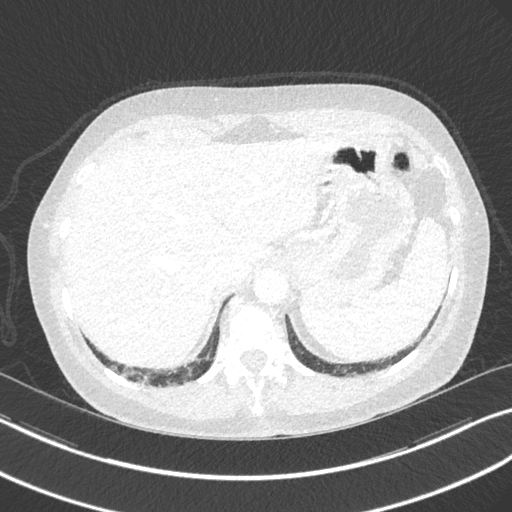
[im 630/756  soft-tissue]
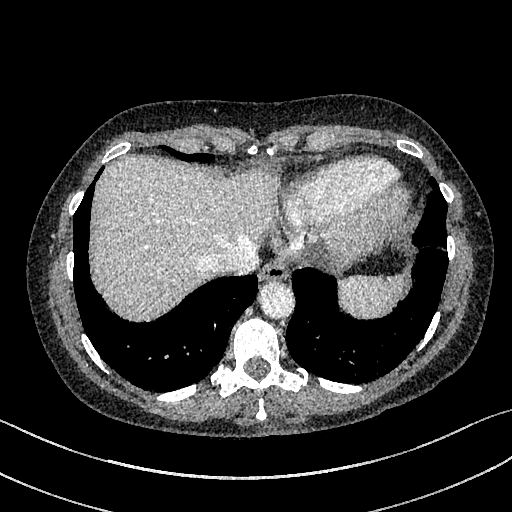
[im 630/756  lung]
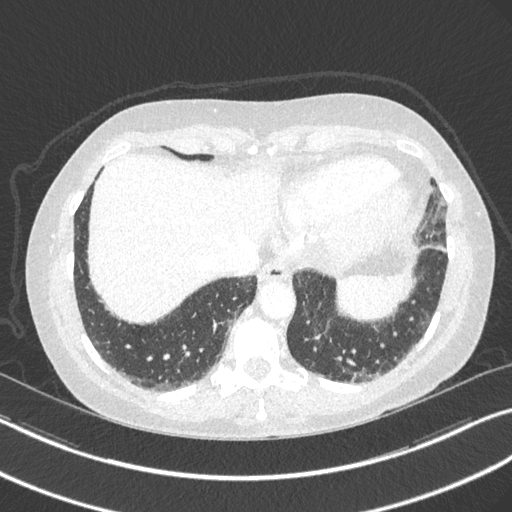
[im 693/756  soft-tissue]
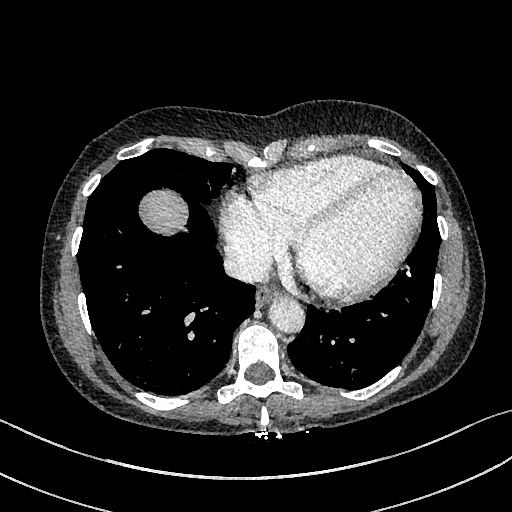
[im 693/756  lung]
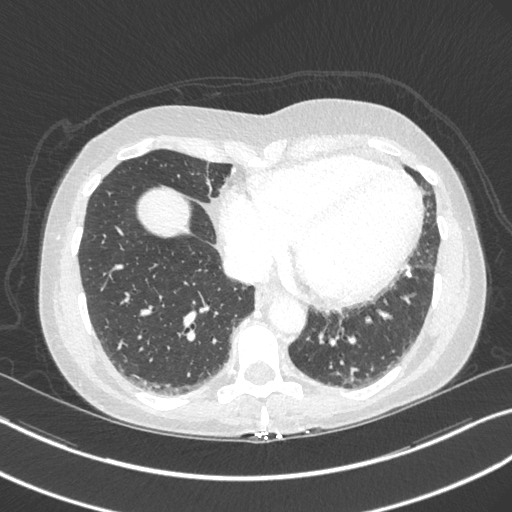
[im 693/756  bone]
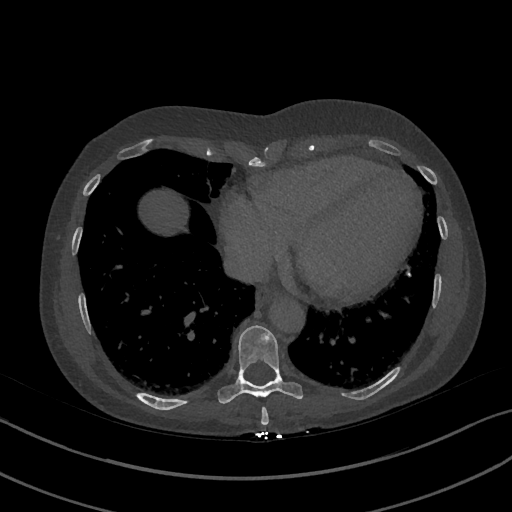

[Series 15: venous cor · coronal · portal-venous · 0.60mm/px · 2 of 151 slices shown]
[im 51/151  soft-tissue]
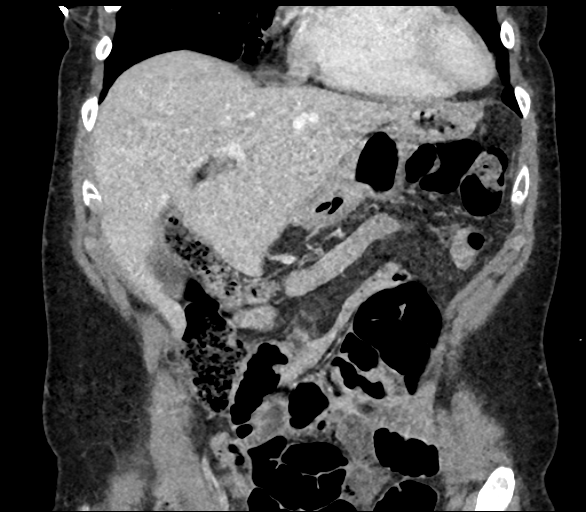
[im 101/151  soft-tissue]
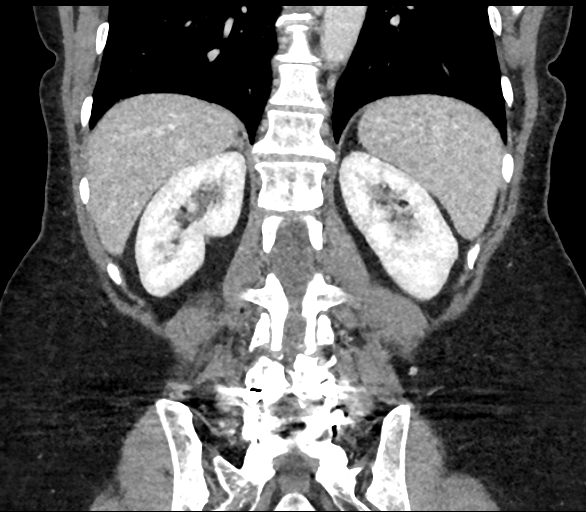

[12 of 46 positions shown; findings below may reference images not displayed]

FINDINGS: VASCULAR

Aorta: Unremarkable course, caliber, contour of the abdominal aorta.
No dissection, aneurysm, or periaortic fluid.

Celiac: No significant atherosclerotic changes at the origin of the
celiac artery. Left gastric artery with a separate origin from the
aorta

SMA: No significant atherosclerotic changes of the superior
mesenteric artery.

Renals: Single right renal artery.  No significant stenosis.

Single left renal artery. No significant stenosis. Main renal artery
measures approximately 2.2 cm before the division.

IMA: Inferior mesenteric artery is patent.

Right lower extremity:

Ectasias of the common iliac artery measuring 14 mm. No significant
stenosis.

Left lower extremity:

Ectasia of the left common iliac artery measuring 14 mm. No
significant stenosis.

Veins: Unremarkable appearance of the venous system.

Review of the MIP images confirms the above findings.

NON-VASCULAR

Lower chest: Atelectatic changes/ scarring at the lung bases.

Hepatobiliary: 17 mm hypodense lesion of the right liver lobe,
incompletely characterized on the current CT. Unremarkable
gallbladder

Pancreas: Unremarkable pancreas

Spleen: Unremarkable spleen

Adrenals/Urinary Tract: Unremarkable appearance of adrenal glands.

Right:

Symmetric perfusion of the right kidney in the left. No
hydronephrosis of the right kidney. No nephrolithiasis. Unremarkable
course of the right ureter.

Left:

Symmetric perfusion of the left kidney and right. No hydronephrosis.
No nephrolithiasis. Unremarkable course of the proximal left ureter.

Stomach/Bowel: Unremarkable stomach. Unremarkable proximal small
bowel. Minimal diverticular disease of the visualized colon. No
inflammatory changes.

Lymphatic: No adenopathy

Other: Small fat containing umbilical hernia

Musculoskeletal: No acute fracture. Surgical changes of posterior
lumbar interbody fusion of L4-L5 with bilateral pedicle screw and
rod fixation, fusion across the disc space, and right laminectomy.
No bony canal narrowing.
IMPRESSION: No acute finding.

No significant vascular disease. Bilateral single renal arteries
with no significant stenosis. Left Main renal artery measures
approximately 2.2 cm before the division.

Hypodense lesion of the right liver lobe measuring 1.7 cm. Although
most likely benign, lesion has indeterminate characteristics on the
current CT protocol. If there is concern for more complete
evaluation, such as in a patient with a known history of malignancy,
either contrast-enhanced MR or ultrasound may be considered.

## 2018-08-29 NOTE — Telephone Encounter (Signed)
Patient is returning call to follow up from prior telephone call.

## 2018-08-29 NOTE — Telephone Encounter (Signed)
Left message to call Sharee Pimple, RN at [facility].    CT chest with contrast

## 2018-08-29 NOTE — Telephone Encounter (Signed)
Patient returned call

## 2018-08-29 NOTE — Telephone Encounter (Signed)
Spoke with patient. Patient had CT of chest 04/29/18, this was f/u on area of enhancement in the right upper lobe of right lung.  States CT showed  area of "diastasis" to left of middle of abdomen. Patient states the area is hard and increased in size. Denies any new symptoms. Patient states she does not have a PCP and has a new Buyer, [employer]. Patient is unsure what providers are covered under her plan, patient is requesting recommendations for referral or f/u. Patient is going to check with her insurance plan for list of covered providers and return call to office. Advised I will forward to Dr. Sabra Heck to review and advise on f/u.   Dr. Sabra Heck -please review and advise.

## 2018-08-30 NOTE — Telephone Encounter (Signed)
Spoke with patient. Patient states she has checked with her plan and would like to see Dr. Luciana Axe, [place]. Patient will contact them directly to schedule appt to establish care. Advised I will update Dr. Sabra Heck and return call to advise on any additional f/u. Patient agreeable.   Routing to Dr. Sabra Heck.

## 2018-08-30 NOTE — Telephone Encounter (Signed)
Spoke with patient. Advised as seen below per Dr. Sabra Heck. Patient would like to proceed with referral to general surgeon or plastic surgeon recommended by Dr. Sabra Heck, will need a [facility] provider. Patient states she is scheduled with PCP 10/03/18, does not wish to wait that long. Advised I will review with Dr. Sabra Heck and return call to advise once referral placed. Patient agreeable.   Dr. Sabra Heck -please advise on referral.

## 2018-08-30 NOTE — Telephone Encounter (Signed)
Patient is calling to give information to Dr.Miller's nurse.

## 2018-08-30 NOTE — Telephone Encounter (Signed)
I'm glad she has decided on a PCP.    A diastasis is a separation of the abdominal wall muscles, usually in the midline, that creates something that looks and feels like a hernia.  It is not a hernia.  These are typically treated with increased abdominal wall strengthening and weight loss.  If this isn't effective, then surgery can be performed.  This is usually done with plastic surgery or general surgery.  She can be referred for this is desired or wait until she sees her new PCP.

## 2018-08-31 ENCOUNTER — Telehealth: Payer: Self-pay | Admitting: Obstetrics & Gynecology

## 2018-08-31 ENCOUNTER — Other Ambulatory Visit: Payer: Self-pay | Admitting: Obstetrics & Gynecology

## 2018-08-31 DIAGNOSIS — M6208 Separation of muscle (nontraumatic), other site: Secondary | ICD-10-CM

## 2018-08-31 NOTE — Telephone Encounter (Signed)
Please let her know I do not know any of the general surgeons (except for some of the speciality surgeons) at [facility].  Dr. Laurance Flatten should be able to do this procedure.  Order for referral placed.

## 2018-08-31 NOTE — Telephone Encounter (Signed)
Call returned to patient, left detailed message, ok per dpr, name identified on voicemail. Advised as seen below per Dr. Sabra Heck, advised referral has been placed, our referral coordinator will f/u with appt details once appt is scheduled. Return call to office if any additional questions/concerns.   Routing to [employer] .   Encounter closed.

## 2018-08-31 NOTE — Progress Notes (Signed)
Referral to general surgery placed as requested by pt.

## 2018-08-31 NOTE — Telephone Encounter (Signed)
Left voicemail regarding referral appointment. The information is listed below. Should the patient need to cancel or reschedule this appointment, Please advise them to call the office they've been referred to in order to reschedule.  [place], [place], [address] Phone: [phone]    Emergency General Surgery 09/20/18 @ 2:30 pm. Please arrive 15 minutes early and bring your insurance card and photo id and list of medications.

## 2018-09-01 NOTE — Telephone Encounter (Signed)
Patient returned call. She received information.

## 2018-09-27 DIAGNOSIS — K439 Ventral hernia without obstruction or gangrene: Secondary | ICD-10-CM | POA: Diagnosis not present

## 2018-09-28 ENCOUNTER — Other Ambulatory Visit: Payer: Self-pay | Admitting: Obstetrics & Gynecology

## 2018-10-07 DIAGNOSIS — Z01818 Encounter for other preprocedural examination: Secondary | ICD-10-CM | POA: Diagnosis not present

## 2018-10-07 DIAGNOSIS — Z905 Acquired absence of kidney: Secondary | ICD-10-CM | POA: Diagnosis not present

## 2018-10-07 DIAGNOSIS — Z01812 Encounter for preprocedural laboratory examination: Secondary | ICD-10-CM | POA: Diagnosis not present

## 2018-10-07 DIAGNOSIS — Z20828 Contact with and (suspected) exposure to other viral communicable diseases: Secondary | ICD-10-CM | POA: Diagnosis not present

## 2018-10-07 DIAGNOSIS — K439 Ventral hernia without obstruction or gangrene: Secondary | ICD-10-CM | POA: Diagnosis not present

## 2018-10-14 DIAGNOSIS — Z905 Acquired absence of kidney: Secondary | ICD-10-CM | POA: Diagnosis not present

## 2018-10-14 DIAGNOSIS — K432 Incisional hernia without obstruction or gangrene: Secondary | ICD-10-CM | POA: Diagnosis not present

## 2018-10-14 DIAGNOSIS — K439 Ventral hernia without obstruction or gangrene: Secondary | ICD-10-CM | POA: Diagnosis not present

## 2018-10-14 DIAGNOSIS — G8918 Other acute postprocedural pain: Secondary | ICD-10-CM | POA: Diagnosis not present

## 2018-10-14 DIAGNOSIS — Z96652 Presence of left artificial knee joint: Secondary | ICD-10-CM | POA: Diagnosis not present

## 2018-10-31 ENCOUNTER — Telehealth: Payer: Self-pay | Admitting: Obstetrics & Gynecology

## 2018-10-31 NOTE — Telephone Encounter (Signed)
VOID

## 2018-12-26 ENCOUNTER — Encounter: Payer: Self-pay | Admitting: Obstetrics & Gynecology

## 2018-12-26 DIAGNOSIS — Z1231 Encounter for screening mammogram for malignant neoplasm of breast: Secondary | ICD-10-CM | POA: Diagnosis not present

## 2019-04-01 ENCOUNTER — Ambulatory Visit: Payer: BLUE CROSS/BLUE SHIELD | Attending: Internal Medicine

## 2019-04-01 DIAGNOSIS — Z23 Encounter for immunization: Secondary | ICD-10-CM | POA: Insufficient documentation

## 2019-04-01 NOTE — Progress Notes (Signed)
   Covid-19 Vaccination Clinic  Name:  Nicole Casey    MRN: [id number] DOB: [birth year]  04/01/2019  [name] was observed post Covid-19 immunization for 15 minutes without incidence. She was provided with Vaccine Information Sheet and instruction to access the V-Safe system.   [name] was instructed to call 911 with any severe reactions post vaccine: Marland Kitchen Difficulty breathing  . Swelling of your face and throat  . A fast heartbeat  . A bad rash all over your body  . Dizziness and weakness    Immunizations Administered    Name Date Dose VIS Date Route   Pfizer COVID-19 Vaccine 04/01/2019  2:34 PM 0.3 mL 01/13/2019 Intramuscular   Manufacturer: [employer], [employer]   Lot: [id number]   NDC: [address]-4

## 2019-04-22 ENCOUNTER — Ambulatory Visit: Payer: BLUE CROSS/BLUE SHIELD | Attending: Internal Medicine

## 2019-04-22 DIAGNOSIS — Z23 Encounter for immunization: Secondary | ICD-10-CM

## 2019-04-22 NOTE — Progress Notes (Signed)
   Covid-19 Vaccination Clinic  Name:  Nicole Casey    MRN: [id number] DOB: 1955-02-24  04/22/2019  [name] was observed post Covid-19 immunization for 15 minutes without incident. She was provided with Vaccine Information Sheet and instruction to access the V-Safe system.   [name] was instructed to call 911 with any severe reactions post vaccine: Marland Kitchen Difficulty breathing  . Swelling of face and throat  . A fast heartbeat  . A bad rash all over body  . Dizziness and weakness   Immunizations Administered    Name Date Dose VIS Date Route   Pfizer COVID-19 Vaccine 04/22/2019  8:33 AM 0.3 mL 01/13/2019 Intramuscular   Manufacturer: [employer], [employer]   Lot: [id number]   NDC: [address]-6

## 2019-06-20 ENCOUNTER — Telehealth: Payer: Self-pay | Admitting: *Deleted

## 2019-06-20 NOTE — Telephone Encounter (Signed)
Spoke with patient. Patient states she has transferred care due to her insurance plan. Patient request to cancel upcoming AEX scheduled for 07/20/19, appt canceled. Patient will f/u with new GYN regarding breast MRI recommendations. Patient thankful for call.   Routing to Dr. Gloris Ham.   Encounter closed.

## 2019-06-20 NOTE — Telephone Encounter (Signed)
-----  Message from Megan Salon, MD sent at 06/19/2019  4:34 AM EDT ----- Regarding: breast MRI Pt has yearly breast MRI done due to being BRCA 1 gene positive.  Could you call about scheduling.  Last MMG was 12/2018.  Thanks.  Vinnie Level

## 2019-07-20 ENCOUNTER — Ambulatory Visit: Payer: BLUE CROSS/BLUE SHIELD | Admitting: Obstetrics & Gynecology
